# Patient Record
Sex: Female | Born: 1955
Health system: Southern US, Community
[De-identification: ages and names within clinical notes are randomized; demographics above are authoritative.]

## PROBLEM LIST (undated history)

## (undated) DIAGNOSIS — E7800 Pure hypercholesterolemia, unspecified: Secondary | ICD-10-CM

## (undated) DIAGNOSIS — F32A Depression, unspecified: Secondary | ICD-10-CM

## (undated) DIAGNOSIS — F329 Major depressive disorder, single episode, unspecified: Secondary | ICD-10-CM

## (undated) DIAGNOSIS — E119 Type 2 diabetes mellitus without complications: Secondary | ICD-10-CM

## (undated) DIAGNOSIS — I10 Essential (primary) hypertension: Secondary | ICD-10-CM

## (undated) HISTORY — DX: Depression, unspecified: F32.A

## (undated) HISTORY — DX: Major depressive disorder, single episode, unspecified: F32.9

---

## 1997-08-18 ENCOUNTER — Other Ambulatory Visit: Admission: RE | Admit: 1997-08-18 | Discharge: 1997-08-18 | Payer: Self-pay | Admitting: *Deleted

## 1997-08-19 ENCOUNTER — Other Ambulatory Visit: Admission: RE | Admit: 1997-08-19 | Discharge: 1997-08-19 | Payer: Self-pay | Admitting: *Deleted

## 2000-09-06 ENCOUNTER — Other Ambulatory Visit: Admission: RE | Admit: 2000-09-06 | Discharge: 2000-09-06 | Payer: Self-pay | Admitting: Family Medicine

## 2001-11-25 ENCOUNTER — Other Ambulatory Visit: Admission: RE | Admit: 2001-11-25 | Discharge: 2001-11-25 | Payer: Self-pay | Admitting: Family Medicine

## 2001-12-04 ENCOUNTER — Encounter: Payer: Self-pay | Admitting: Family Medicine

## 2001-12-04 ENCOUNTER — Encounter: Admission: RE | Admit: 2001-12-04 | Discharge: 2001-12-04 | Payer: Self-pay | Admitting: Family Medicine

## 2002-12-09 ENCOUNTER — Encounter: Admission: RE | Admit: 2002-12-09 | Discharge: 2002-12-09 | Payer: Self-pay | Admitting: Family Medicine

## 2002-12-09 ENCOUNTER — Encounter: Payer: Self-pay | Admitting: Family Medicine

## 2002-12-16 ENCOUNTER — Encounter: Payer: Self-pay | Admitting: Family Medicine

## 2002-12-16 ENCOUNTER — Encounter: Admission: RE | Admit: 2002-12-16 | Discharge: 2002-12-16 | Payer: Self-pay | Admitting: Family Medicine

## 2003-02-10 ENCOUNTER — Other Ambulatory Visit: Admission: RE | Admit: 2003-02-10 | Discharge: 2003-02-10 | Payer: Self-pay | Admitting: Family Medicine

## 2004-02-10 ENCOUNTER — Encounter: Admission: RE | Admit: 2004-02-10 | Discharge: 2004-02-10 | Payer: Self-pay | Admitting: Family Medicine

## 2004-02-24 ENCOUNTER — Encounter: Admission: RE | Admit: 2004-02-24 | Discharge: 2004-02-24 | Payer: Self-pay | Admitting: Family Medicine

## 2005-04-26 ENCOUNTER — Encounter: Admission: RE | Admit: 2005-04-26 | Discharge: 2005-04-26 | Payer: Self-pay | Admitting: Family Medicine

## 2006-04-29 ENCOUNTER — Encounter: Admission: RE | Admit: 2006-04-29 | Discharge: 2006-04-29 | Payer: Self-pay | Admitting: Family Medicine

## 2006-06-13 ENCOUNTER — Emergency Department (HOSPITAL_COMMUNITY): Admission: EM | Admit: 2006-06-13 | Discharge: 2006-06-14 | Payer: Self-pay | Admitting: Emergency Medicine

## 2007-06-26 ENCOUNTER — Encounter: Admission: RE | Admit: 2007-06-26 | Discharge: 2007-06-26 | Payer: Self-pay | Admitting: Family Medicine

## 2008-12-28 ENCOUNTER — Encounter: Admission: RE | Admit: 2008-12-28 | Discharge: 2008-12-28 | Payer: Self-pay | Admitting: Family Medicine

## 2009-09-06 ENCOUNTER — Emergency Department (HOSPITAL_COMMUNITY)
Admission: EM | Admit: 2009-09-06 | Discharge: 2009-09-06 | Payer: Self-pay | Source: Home / Self Care | Admitting: Emergency Medicine

## 2010-05-26 ENCOUNTER — Other Ambulatory Visit: Payer: Self-pay | Admitting: Family Medicine

## 2010-05-26 DIAGNOSIS — Z1231 Encounter for screening mammogram for malignant neoplasm of breast: Secondary | ICD-10-CM

## 2010-05-31 ENCOUNTER — Ambulatory Visit
Admission: RE | Admit: 2010-05-31 | Discharge: 2010-05-31 | Disposition: A | Discharge status: Home / Self Care | Payer: BC Managed Care – PPO | Source: Ambulatory Visit | Attending: Family Medicine | Admitting: Family Medicine

## 2010-05-31 DIAGNOSIS — Z1231 Encounter for screening mammogram for malignant neoplasm of breast: Secondary | ICD-10-CM

## 2010-06-02 ENCOUNTER — Emergency Department (HOSPITAL_COMMUNITY)
Admission: EM | Admit: 2010-06-02 | Discharge: 2010-06-02 | Disposition: A | Discharge status: Home / Self Care | Payer: BC Managed Care – PPO | Attending: Emergency Medicine | Admitting: Emergency Medicine

## 2010-06-02 ENCOUNTER — Emergency Department (HOSPITAL_COMMUNITY): Payer: BC Managed Care – PPO

## 2010-06-02 DIAGNOSIS — E7800 Pure hypercholesterolemia, unspecified: Secondary | ICD-10-CM | POA: Insufficient documentation

## 2010-06-02 DIAGNOSIS — R079 Chest pain, unspecified: Secondary | ICD-10-CM | POA: Insufficient documentation

## 2010-06-02 DIAGNOSIS — E119 Type 2 diabetes mellitus without complications: Secondary | ICD-10-CM | POA: Insufficient documentation

## 2010-06-02 DIAGNOSIS — F319 Bipolar disorder, unspecified: Secondary | ICD-10-CM | POA: Insufficient documentation

## 2010-06-02 DIAGNOSIS — I10 Essential (primary) hypertension: Secondary | ICD-10-CM | POA: Insufficient documentation

## 2010-06-02 DIAGNOSIS — Z79899 Other long term (current) drug therapy: Secondary | ICD-10-CM | POA: Insufficient documentation

## 2010-06-02 LAB — BASIC METABOLIC PANEL
CO2: 27 mEq/L (ref 19–32)
Creatinine, Ser: 0.63 mg/dL (ref 0.4–1.2)
GFR calc Af Amer: >60 mL/min (ref >60)
Potassium: 3.8 mEq/L (ref 3.5–5.1)
Sodium: 137 mEq/L (ref 135–145)

## 2010-06-02 LAB — DIFFERENTIAL
Basophils Relative: 1 % (ref 0–1)
Eosinophils Absolute: 0.4 10*3/uL (ref 0.0–0.7)
Lymphocytes Relative: 36 % (ref 12–46)
Neutro Abs: 4 10*3/uL (ref 1.7–7.7)
Neutrophils Relative %: 52 % (ref 43–77)

## 2010-06-02 LAB — CBC
HCT: 40.2 % (ref 36.0–46.0)
Hemoglobin: 13.1 g/dL (ref 12.0–15.0)
MCHC: 32.6 g/dL (ref 30.0–36.0)
Platelets: 209 10*3/uL (ref 150–400)
RBC: 4.59 MIL/uL (ref 3.87–5.11)
WBC: 7.7 10*3/uL (ref 4.0–10.5)

## 2010-06-02 LAB — POCT CARDIAC MARKERS
CKMB, poc: 1.4 ng/mL (ref 1.0–8.0)
Myoglobin, poc: 52 ng/mL (ref 12–200)

## 2011-02-12 ENCOUNTER — Ambulatory Visit
Admission: RE | Admit: 2011-02-12 | Discharge: 2011-02-12 | Disposition: A | Discharge status: Home / Self Care | Payer: BC Managed Care – PPO | Source: Ambulatory Visit | Attending: Family Medicine | Admitting: Family Medicine

## 2011-02-12 ENCOUNTER — Other Ambulatory Visit: Payer: Self-pay | Admitting: Family Medicine

## 2011-02-12 DIAGNOSIS — R52 Pain, unspecified: Secondary | ICD-10-CM

## 2011-07-16 ENCOUNTER — Ambulatory Visit
Admission: RE | Admit: 2011-07-16 | Discharge: 2011-07-16 | Disposition: A | Discharge status: Home / Self Care | Payer: BC Managed Care – PPO | Source: Ambulatory Visit | Attending: Family Medicine | Admitting: Family Medicine

## 2011-07-16 ENCOUNTER — Other Ambulatory Visit: Payer: Self-pay | Admitting: Family Medicine

## 2011-07-16 DIAGNOSIS — Z1231 Encounter for screening mammogram for malignant neoplasm of breast: Secondary | ICD-10-CM

## 2011-08-30 ENCOUNTER — Ambulatory Visit: Payer: BC Managed Care – PPO | Admitting: Dietician

## 2012-06-27 ENCOUNTER — Emergency Department (INDEPENDENT_AMBULATORY_CARE_PROVIDER_SITE_OTHER): Payer: 59

## 2012-06-27 ENCOUNTER — Emergency Department (HOSPITAL_COMMUNITY): Payer: 59

## 2012-06-27 ENCOUNTER — Encounter (HOSPITAL_COMMUNITY): Payer: Self-pay | Admitting: Emergency Medicine

## 2012-06-27 ENCOUNTER — Emergency Department (HOSPITAL_COMMUNITY)
Admission: EM | Admit: 2012-06-27 | Discharge: 2012-06-27 | Disposition: A | Discharge status: Home / Self Care | Payer: 59 | Source: Home / Self Care | Attending: Emergency Medicine | Admitting: Emergency Medicine

## 2012-06-27 DIAGNOSIS — S161XXA Strain of muscle, fascia and tendon at neck level, initial encounter: Secondary | ICD-10-CM

## 2012-06-27 DIAGNOSIS — S20211A Contusion of right front wall of thorax, initial encounter: Secondary | ICD-10-CM

## 2012-06-27 DIAGNOSIS — S20219A Contusion of unspecified front wall of thorax, initial encounter: Secondary | ICD-10-CM

## 2012-06-27 DIAGNOSIS — S139XXA Sprain of joints and ligaments of unspecified parts of neck, initial encounter: Secondary | ICD-10-CM

## 2012-06-27 LAB — POCT I-STAT, CHEM 8
BUN: 15 mg/dL (ref 6–23)
Chloride: 103 mEq/L (ref 96–112)
Potassium: 3.7 mEq/L (ref 3.5–5.1)
Sodium: 138 mEq/L (ref 135–145)
TCO2: 24 mmol/L (ref 0–100)

## 2012-06-27 MED ORDER — SALSALATE 750 MG PO TABS: 1500.0000 mg | ORAL_TABLET | Freq: Two times a day (BID) | ORAL | Status: DC

## 2012-06-27 MED ORDER — HYDROCODONE-ACETAMINOPHEN 5-325 MG PO TABS
1.0000 | ORAL_TABLET | Freq: Once | ORAL | Status: AC
  Administered 2012-06-27: 1 via ORAL

## 2012-06-27 MED ORDER — HYDROCODONE-ACETAMINOPHEN 5-325 MG PO TABS: ORAL_TABLET | ORAL | Status: DC

## 2012-06-27 MED ORDER — HYDROCODONE-ACETAMINOPHEN 5-325 MG PO TABS
ORAL_TABLET | ORAL | Status: AC
  Filled 2012-06-27: qty 1

## 2012-06-27 MED ORDER — METHOCARBAMOL 500 MG PO TABS: 500.0000 mg | ORAL_TABLET | Freq: Three times a day (TID) | ORAL | Status: DC

## 2012-06-27 NOTE — ED Provider Notes (Signed)
Chief Complaint:   Chief Complaint  Patient presents with  . Fall    pt fell walking from car scraping right arm and hitting head, no loss of consciousness. pt states that she has been feeling weak and fatigue today.     History of Present Illness:   Amber Patterson is a-year-old female with diabetes and hypertension who was walking across the parking lot of the [employer] today when she tripped over some loose concrete, fell, and hit her head on a concrete pole. There was no loss of consciousness. She complained of some pain in the right side of the neck and the right chest after the fall, she denies any headache or visual symptoms. There was no bleeding from nose or ears. She denies any neurological symptoms. She was able to move her neck but with some pain. She denies any pain with respiration or hemoptysis. She had no abdominal or extremity pain. She states she's felt tired all day long. She's not sure why. She is on a blood pressure pill and 2 diabetes medications although she's not sure the names of the meds. Her blood sugars and blood pressure have been under good control as far she knows. She sees Dr. Nathanial Rancher in [place]. She states she did not pass out and denies any chest pain, tightness, pressure, palpitations, or shortness of breath.  Review of Systems:  Other than noted above, the patient denies any of the following symptoms: Systemic:  No fevers or chills. Eye:  No diplopia or blurred vision. ENT:  No headache, facial pain, or bleeding from the nose or ears.  No loose or broken teeth. Neck:  No neck pain or stiffnes. Resp:  No shortness of breath. Cardiac:  No chest pain. No palpitations, dizziness, syncope or fainting. GI:  No abdominal pain. No nausea, vomiting, or diarrhea. GU:  No blood in urine. M-S:  No extremity pain, swelling, bruising, limited ROM, neck or back pain. Neuro:  No headache, loss of consciousness, seizure activity, dizziness, vertigo, paresthesias, numbness, or  weakness.  No difficulty with speech or ambulation.  PMFSH:  Past medical history, family history, social history, meds, and allergies were reviewed.    Physical Exam:   Vital signs:  BP 178/100  Pulse 66  Temp(Src) 99 F (37.2 C) (Oral)  Resp 20  SpO2 98% General:  Alert, oriented and in no distress. Eye:  PERRL, full EOMs. ENT:  No cranial or facial tenderness to palpation. Neck:  She has tenderness to palpation over the right trapezius ridge and right sternocleidomastoid muscles. There is no midline tenderness to palpation. Her neck has 45 of rotation in each direction but with pain. Heart:  Regular rhythm.  No extrasystoles, gallops, or murmers. Lungs:  There is minimal chest wall tenderness to palpation in the right pectoral area, no swelling, bruising, or deformity. Breath sounds clear and equal bilaterally.  No wheezes, rales or rhonchi. Abdomen:  Non tender. Back:  Non tender to palpation.  Full ROM without pain. Extremities:  No tenderness, swelling, bruising or deformity.  Full ROM of all joints without pain.  Pulses full.  Brisk capillary refill. Neuro:  Alert and oriented times 3.  Cranial nerves intact.  No muscle weakness.  Sensation intact to light touch.  Gait normal. Skin:  No bruising, abrasions, or lacerations.  EKG Results:  Date: 06/27/2012  Rate: 62  Rhythm: normal sinus rhythm and sinus arrhythmia  QRS Axis: normal  Intervals: normal  ST/T Wave abnormalities: nonspecific T wave changes  Conduction Disutrbances:none  Narrative Interpretation: Normal sinus rhythm with sinus arrhythmia, nonspecific T wave abnormalities, with T-wave inversions in leads III, aVF, V4, V5, and V6. All these changes were present on a previous EKG from 06/02/2010.  Old EKG Reviewed: unchanged  Results for orders placed during the hospital encounter of 06/27/12  POCT I-STAT, CHEM 8      Result Value Range   Sodium 138  135 - 145 mEq/L   Potassium 3.7  3.5 - 5.1 mEq/L   Chloride  103  96 - 112 mEq/L   BUN 15  6 - 23 mg/dL   Creatinine, Ser 9.14  0.50 - 1.10 mg/dL   Glucose, Bld 782 (*) 70 - 99 mg/dL   Calcium, Ion 9.56 (*) 1.12 - 1.23 mmol/L   TCO2 24  0 - 100 mmol/L   Hemoglobin 15.0  12.0 - 15.0 g/dL   HCT 21.3  08.6 - 57.8 %   Radiology:  Dg Ribs Unilateral W/chest Right  06/27/2012  *RADIOLOGY REPORT*  Clinical Data: History of fall complaining of pain in the upper right chest.  RIGHT RIBS AND CHEST - 3+ VIEW  Comparison: Chest x-ray 06/02/2010.  Findings: Lung volumes are normal.  No consolidative airspace disease.  No pleural effusions.  No pneumothorax.  No pulmonary nodule or mass noted.  Pulmonary vasculature and the cardiomediastinal silhouette are within normal limits.  Dedicated views of the right-sided ribs demonstrate no acute displaced rib fractures.  IMPRESSION: 1. No radiographic evidence of acute cardiopulmonary disease. 2.  No acute displaced rib fractures.   Original Report Authenticated By: Trudie Reed, M.D.    Dg Cervical Spine Complete  06/27/2012  *RADIOLOGY REPORT*  Clinical Data: Fall with neck pain.  CERVICAL SPINE - COMPLETE 4+ VIEW  Comparison: 09/06/2009 cervical spine CT  Findings: Normal alignment is noted. There is no evidence of fracture, subluxation or prevertebral soft tissue swelling. Moderate degenerative disc disease and spondylosis from C4-C7 noted. No focal bony lesions are identified.  IMPRESSION: No static evidence of acute injury to the cervical spine.  Moderate degenerative changes from C4-C7.   Original Report Authenticated By: Harmon Pier, M.D.    Course in Urgent Care Center:   She was given Norco 5/325 with good relief of her pain.  Assessment:  The primary encounter diagnosis was Cervical strain, initial encounter. A diagnosis of Chest wall contusion, right, initial encounter was also pertinent to this visit.  She has marked degenerative disease and spondylosis from C4-C7. This may predispose her to a neck strain. Her  tiredness and fatigue may be due to diabetes and blood pressure not been well controlled. I have suggested that she see her primary care physician next week to discuss her diabetic and blood pressure control. I do not want to make any changes in her medication right now, since she was unable to give me the names of her blood pressure and diabetes medication.  Plan:   1.  The following meds were prescribed:   New Prescriptions   HYDROCODONE-ACETAMINOPHEN (NORCO/VICODIN) 5-325 MG PER TABLET    1 to 2 tabs every 4 to 6 hours as needed for pain.   METHOCARBAMOL (ROBAXIN) 500 MG TABLET    Take 1 tablet (500 mg total) by mouth 3 (three) times daily.   SALSALATE (DISALCID) 750 MG TABLET    Take 2 tablets (1,500 mg total) by mouth 2 (two) times daily.   2.  The patient was instructed in symptomatic care and handouts were given. 3.  The patient was told to return if becoming worse in any way, if no better in 3 or 4 days, and given some red flag symptoms such as headache, worsening neck pain, neurological symptoms, or change in level of consciousness that would indicate earlier return.    Reuben Likes, MD 06/27/12 2129

## 2012-06-27 NOTE — ED Notes (Signed)
Pt states that while walking across parking lot was feeling dizzy/fatigue/weak. Fell scraping right arm and hitting head, did not lose consciousness. States feeling weak and fatigue all day.

## 2012-08-04 ENCOUNTER — Other Ambulatory Visit: Payer: Self-pay | Admitting: Family Medicine

## 2012-08-04 DIAGNOSIS — Z1231 Encounter for screening mammogram for malignant neoplasm of breast: Secondary | ICD-10-CM

## 2012-08-15 ENCOUNTER — Ambulatory Visit
Admission: RE | Admit: 2012-08-15 | Discharge: 2012-08-15 | Disposition: A | Discharge status: Home / Self Care | Payer: 59 | Source: Ambulatory Visit | Attending: Family Medicine | Admitting: Family Medicine

## 2012-08-15 DIAGNOSIS — Z1231 Encounter for screening mammogram for malignant neoplasm of breast: Secondary | ICD-10-CM

## 2012-12-09 ENCOUNTER — Encounter (HOSPITAL_COMMUNITY): Payer: Self-pay | Admitting: Emergency Medicine

## 2012-12-09 ENCOUNTER — Emergency Department (INDEPENDENT_AMBULATORY_CARE_PROVIDER_SITE_OTHER): Payer: PRIVATE HEALTH INSURANCE

## 2012-12-09 ENCOUNTER — Emergency Department (HOSPITAL_COMMUNITY)
Admission: EM | Admit: 2012-12-09 | Discharge: 2012-12-09 | Disposition: A | Discharge status: Home / Self Care | Payer: Self-pay | Source: Home / Self Care | Attending: Emergency Medicine | Admitting: Emergency Medicine

## 2012-12-09 DIAGNOSIS — S335XXA Sprain of ligaments of lumbar spine, initial encounter: Secondary | ICD-10-CM

## 2012-12-09 DIAGNOSIS — S139XXA Sprain of joints and ligaments of unspecified parts of neck, initial encounter: Secondary | ICD-10-CM

## 2012-12-09 DIAGNOSIS — S39012A Strain of muscle, fascia and tendon of lower back, initial encounter: Secondary | ICD-10-CM

## 2012-12-09 DIAGNOSIS — S161XXA Strain of muscle, fascia and tendon at neck level, initial encounter: Secondary | ICD-10-CM

## 2012-12-09 MED ORDER — METHOCARBAMOL 500 MG PO TABS: 500.0000 mg | ORAL_TABLET | Freq: Two times a day (BID) | ORAL | Status: DC

## 2012-12-09 MED ORDER — HYDROCODONE-ACETAMINOPHEN 5-325 MG PO TABS: 2.0000 | ORAL_TABLET | ORAL | Status: DC | PRN
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
## 2012-12-09 NOTE — ED Provider Notes (Signed)
Medical screening examination/treatment/procedure(s) were performed by non-physician practitioner and as supervising physician I was immediately available for consultation/collaboration.  Vander Kueker, M.D.  Mahiya Kercheval C Karesa Maultsby, MD 12/09/12 2117 

## 2013-04-23 ENCOUNTER — Encounter (HOSPITAL_COMMUNITY): Payer: Self-pay | Admitting: Emergency Medicine

## 2013-04-23 ENCOUNTER — Other Ambulatory Visit (HOSPITAL_COMMUNITY)
Admission: RE | Admit: 2013-04-23 | Discharge: 2013-04-23 | Disposition: A | Discharge status: Home / Self Care | Payer: 59 | Source: Ambulatory Visit | Attending: Emergency Medicine | Admitting: Emergency Medicine

## 2013-04-23 ENCOUNTER — Emergency Department (INDEPENDENT_AMBULATORY_CARE_PROVIDER_SITE_OTHER)
Admission: EM | Admit: 2013-04-23 | Discharge: 2013-04-23 | Disposition: A | Discharge status: Home / Self Care | Payer: 59 | Source: Home / Self Care | Attending: Emergency Medicine | Admitting: Emergency Medicine

## 2013-04-23 DIAGNOSIS — Z113 Encounter for screening for infections with a predominantly sexual mode of transmission: Secondary | ICD-10-CM | POA: Insufficient documentation

## 2013-04-23 DIAGNOSIS — B373 Candidiasis of vulva and vagina: Secondary | ICD-10-CM

## 2013-04-23 DIAGNOSIS — IMO0002 Reserved for concepts with insufficient information to code with codable children: Secondary | ICD-10-CM

## 2013-04-23 DIAGNOSIS — N760 Acute vaginitis: Secondary | ICD-10-CM | POA: Insufficient documentation

## 2013-04-23 DIAGNOSIS — IMO0001 Reserved for inherently not codable concepts without codable children: Secondary | ICD-10-CM

## 2013-04-23 DIAGNOSIS — E1165 Type 2 diabetes mellitus with hyperglycemia: Secondary | ICD-10-CM

## 2013-04-23 DIAGNOSIS — B3731 Acute candidiasis of vulva and vagina: Secondary | ICD-10-CM

## 2013-04-23 HISTORY — DX: Pure hypercholesterolemia, unspecified: E78.00

## 2013-04-23 HISTORY — DX: Essential (primary) hypertension: I10

## 2013-04-23 HISTORY — DX: Type 2 diabetes mellitus without complications: E11.9

## 2013-04-23 LAB — CERVICOVAGINAL ANCILLARY ONLY
WET PREP (BD AFFIRM): NEGATIVE
WET PREP (BD AFFIRM): NEGATIVE
Wet Prep (BD Affirm): NEGATIVE

## 2013-04-23 LAB — POCT URINALYSIS DIP (DEVICE)
Bilirubin Urine: NEGATIVE
Glucose, UA: >=1000 mg/dL — AB
KETONES UR: NEGATIVE mg/dL
LEUKOCYTES UA: NEGATIVE
Nitrite: NEGATIVE
PH: 5.5 (ref 5.0–8.0)
PROTEIN: NEGATIVE mg/dL
Specific Gravity, Urine: 1.015 (ref 1.005–1.030)
UROBILINOGEN UA: 0.2 mg/dL (ref 0.0–1.0)

## 2013-04-23 LAB — HIV ANTIBODY (ROUTINE TESTING W REFLEX): HIV: NONREACTIVE

## 2013-04-23 LAB — RPR: RPR Ser Ql: NONREACTIVE

## 2013-04-23 LAB — GLUCOSE, CAPILLARY: GLUCOSE-CAPILLARY: 328 mg/dL — AB (ref 70–99)

## 2013-04-23 MED ORDER — FLUCONAZOLE 200 MG PO TABS: 200.0000 mg | ORAL_TABLET | ORAL | Status: AC

## 2013-04-23 MED ORDER — TERCONAZOLE 0.4 % VA CREA: 1.0000 | TOPICAL_CREAM | Freq: Every day | VAGINAL | Status: DC

## 2013-04-23 MED ORDER — CEPHALEXIN 500 MG PO CAPS: 500.0000 mg | ORAL_CAPSULE | Freq: Four times a day (QID) | ORAL | Status: DC

## 2013-04-23 NOTE — Discharge Instructions (Signed)
Follow up with your doctor next week.  You need to have your skin rechecked to make sure it is healing. Please start taking your diabetes medicine again.  You are important! Your family wants you around for a long time.    Disorders of the Vulva It is important to know the outside (external) area of the female genitalia to properly examine yourself. The vulva or labia, sometimes also called the lips around the vagina, covers the pubic bone and surrounds the vagina. The larger or outer labia is called the labia majora. The inner or smaller labia is called the labia minora. The clitoris is at the top of the vulva. It is covered by a small area of tissue called the hood. Below the clitoris is the opening of the tube from the bladder, which is the urethral opening. Just below the urethral opening is the opening of the vagina. This area is called the vestibule. Inside the vestibule are bartholin and skene glands that lubricate the outside of the vagina during sexual intercourse. The perineum is the area that lies between the vagina and anus. You should examine your external genitalia once a month just as you do your self breast examination. SIGNS AND SYMPTOMS TO LOOK FOR DURING YOUR EXAMINATION:  Swelling.  Redness.  Bumps.  Blisters.  Black or white areas.  Itching.  Bleeding.  Burning. TYPES OF VULVAR PROBLEMS  Infections.  Yeast (fungus) is the most common infection that causes redness, swelling, itching and a thick white vaginal discharge. Women with diabetes, taking medicines that kill germs (antibiotics) or on birth control pills are at risk for yeast infection. This infection is treated with vaginal creams, suppositories and anti-itch cream for the vulva.  Genital warts (condyloma) are caused by the human papilloma virus. They are raised bumps that can itch, bleed, cause discomfort and sometimes appear in groups like cauliflower. This is a sexually transmitted disease (STD) caused by  sexual contact. This can be treated with topical medication, freezing, electrocautery, laser or surgical removal.  Genital herpes is a virus that causes redness, swelling, blisters and ulcers that can cause itching and are very painful. This is also a STD. There are medications to control the symptoms of herpes, but there is no cure. Once you have it, you keep getting it because the virus stays in your body.  Contact dermatitis. Contact dermatitis is an irritation of the vulva that can cause redness, swelling and itching. It can be caused by:  Perfumed toilet paper.  Deodorants.  Talcum powder.  Hygiene sprays.  Tampons.  Soaps and fabric softeners.  Wearing wet underwear and bathing suits too long.  Spermicide.  Condoms.  Diaphragms.  Poison ivy. Treatment will depend on eliminating the cause and treating the symptoms.  Vulvodynia.  Vulvodynia is "painful vulva." It includes burning, itching, irritation and a feeling of rawness or bruising of the vulva. Treating this problem depends on the cause and diagnosis. Treatment can take a long time.  Vulvar Dystrophy.  Vulvar Dystrophy is a disorder of the skin of the vulva. The skin can be too thick with hard raised patches or too thin skin showing wrinkles. Vulvar dystrophy can cause redness or white patches, itching and burning sensation. A biopsy may be needed to diagnose the problem. Treatment with creams or ointments depends on the diagnosis.  Vulvar Cancer.  Vulvar cancer is usually a form of squamus skin cancer. Other types of vulvar cancer like melenoma (a dark or black, irregular shaped mole that  can bleed easily) and adenocarcinoma (red, itchy, scaly area that looks like eczema) are rare. Treatment is usually surgery to remove the cancerous area, the vulva and the lymph nodes in the groin. This can be done with or without radiation and chemotherapy. HOME CARE INSTRUCTIONS   Look at your vulva and external genital area  every month.  Follow and finish all treatment given to you by your caregiver.  Do not use perfumed or scented soaps, detergents, hygiene spray, talcum powder, tampons, douches or toilet paper.  Remove your tampon every 6 hours. Do not leave tampons in overnight.  Wear loose-fitting clothing.  Wear underwear that is cotton.  Avoid spermicidal creams or foams that irritate you. SEEK MEDICAL CARE IF:  Type 2 Diabetes Mellitus, Adult Type 2 diabetes mellitus is a long-term (chronic) disease. In type 2 diabetes:  The pancreas does not make enough of a hormone called insulin.  The cells in the body do not respond as well to the insulin that is made.  Both of the above can happen. Normally, insulin moves sugars from food into tissue cells. This gives you energy. If you have type 2 diabetes, sugars cannot be moved into tissue cells. This causes high blood sugar (hyperglycemia).  HOME CARE  Have your hemoglobin A1c level checked twice a year. The level shows if your diabetes is under control or out of control.  Perform daily blood sugar testing as told by your doctor.  Check your ketone levels by testing your pee (urine) when you are sick and as told.  Take your diabetes or insulin medicine as told by your doctor.  Never run out of insulin.  Adjust how much insulin you give yourself based on how many carbs (carbohydrates) you eat. Carbs are in many foods, such as fruits, vegetables, whole grains, and dairy products.  Have a healthy snack between every healthy meal. Have 3 meals and 3 snacks a day.  Lose weight if you are overweight.  Carry a medical alert card or wear your medical alert jewelry.  Carry a 15 gram carb snack with you at all times. Examples include:  Glucose pills, 3 or 4.  Glucose gel, 15 gram tube.  Raisins, 2 tablespoons (24 grams).  Jelly beans, 6.  Animal crackers, 8.  Sugar pop, 4 ounces (120 milliliters).  Gummy treats, 9.  Notice low blood sugar  (hypoglycemia) symptoms, such as:  Shaking (tremors).  Decreased ability to think clearly.  Sweating.  Increased heart rate.  Headache.  Dry mouth.  Hunger.  Crabbiness (irritability).  Being worried or tense (anxiety).  Restless sleep.  A change in speech or coordination.  Confusion.  Treat low blood sugar right away. If you are alert and can swallow, follow the 15:15 rule:  Take 15 20 grams of a rapid-acting glucose or carb. This includes glucose gel, glucose pills, or 4 ounces (120 milliliters) of fruit juice, regular pop, or low-fat milk.  Check your blood sugar level after taking the glucose.  Take 15 20 grams of more glucose if the repeat blood sugar level is still 70 mg/dL (milligrams/deciliter) or below.  Eat a meal or snack within 1 hour of the blood sugar levels going back to normal.  Notice early symptoms of high blood sugar, such as:  Being really thirsty or drinking a lot (polydipsia).  Peeing (urinating) a lot (polyuria).  Do at least 150 minutes of physical activity a week or as told.  Split the 150 minutes of activity up during the  week. Do not do 150 minutes of activity in one day.  Perform exercises, such as weight lifting, at least 2 times a week or as told.  Adjust your insulin or food intake as needed if you start a new exercise or sport.  Follow your sick day plan when you are not able to eat or drink as usual.  Avoid tobacco use.  Women who are not pregnant should drink no more than 1 drink a day. Men should drink no more than 2 drinks a day.  Only drink alcohol with food.  Ask your doctor if alcohol is safe for you.  Tell your doctor if you drink alcohol several times during the week.  See your doctor regularly.  Schedule an eye exam soon after you are diagnosed with diabetes. Schedule exams once every year.  Check your skin and feet every day. Check for cuts, bruises, redness, nail problems, bleeding, blisters, or sores. A  doctor should do a foot exam once a year.  Brush your teeth and gums twice a day. Floss once a day. Visit your dentist regularly.  Share your diabetes plan with your workplace or school.  Stay up-to-date with shots that fight against diseases (immunizations).  Learn how to manage stress.  Get diabetes education and support as needed.  Ask your doctor for special help if:  You need help to maintain or improve how you to do things on your own.  You need help to maintain or improve the quality of your life.  You have foot or hand problems.  You have trouble cleaning yourself, dressing, eating, or doing physical activity. GET HELP RIGHT AWAY IF:  You have trouble breathing.  You have moderate to large ketone levels.  You are unable to eat food or drink fluids for more than 6 hours.  You feel sick to your stomach (nauseous) or throw up (vomit) for more than 6 hours.  Your blood sugar level is over 240 mg/dL.  There is a change in mental status.  You get another serious illness.  You have watery poop (diarrhea) for more than 6 hours.  You have been sick or have had a fever for 2 or more days and are not getting better.  You have pain when you are physically active. MAKE SURE YOU:  Understand these instructions.  Will watch your condition.  Will get help right away if you are not doing well or get worse. Document Released: 11/29/2007 Document Revised: 12/10/2012 Document Reviewed: 06/20/2012 [facility] Patient Information 2014 [place], [place].   You notice changes on your vulva such as redness, swelling, itching, color changes, bleeding, small bumps, blisters or any discomfort.  You develop an abnormal vaginal discharge.  You have painful sexual intercourse.  You notice swelling of the lymph nodes in your groin. Document Released: 08/08/2007 Document Revised: 05/14/2011 Document Reviewed: 05/22/2010 [facility] Patient Information 2014 [place], [place].

## 2013-04-23 NOTE — ED Provider Notes (Signed)
Medical screening examination/treatment/procedure(s) were performed by non-physician practitioner and as supervising physician I was immediately available for consultation/collaboration.  Leslee Homeavid Benino Korinek, M.D.  Reuben Likesavid C Jhoselin Crume, MD 04/23/13 1045

## 2013-04-23 NOTE — ED Notes (Signed)
C/o yeast infection due to having a discharge States area is itchy and has odor Would like to be checked for std

## 2013-04-23 NOTE — ED Provider Notes (Signed)
CSN: [id number]     Arrival date & time 04/23/13  [id number] History   None    Chief Complaint  Patient presents with  . Vaginitis  . Exposure to STD     (Consider location/radiation/quality/duration/timing/severity/associated sxs/prior Treatment) HPI Comments: Hx dm. Does not take her medicine.  Has had vaginal yeast infections on and off for an unknown period of time (pt cannot specify). Latest sx have been around for about a month. Has had unprotected sex at some point in time recently (pt cannot specify) and she is worried about STIs.     Patient is a 58 y.o. female presenting with vaginal itching. The history is provided by the patient.  Vaginal Itching This is a new problem. Episode onset: "awhile ago" The problem occurs constantly. The problem has been gradually worsening. Pertinent negatives include no abdominal pain. Exacerbated by: feels pain in vulvar area with urination. Nothing relieves the symptoms. Treatments tried: otc yeast infection oral pills. The treatment provided no relief.    Past Medical History  Diagnosis Date  . Hypertension   . High cholesterol   . Diabetes mellitus without complication    History reviewed. No pertinent past surgical history. History reviewed. No pertinent family history. History  Substance Use Topics  . Smoking status: Never Smoker   . Smokeless tobacco: Not on file  . Alcohol Use: No   OB History   Grav Para Term Preterm Abortions TAB SAB Ect Mult Living                 Review of Systems  Constitutional: Negative for fever and chills.  Gastrointestinal: Negative for nausea, vomiting and abdominal pain.  Genitourinary: Negative for dysuria, flank pain and vaginal discharge.       Vulvar pain/irritation      Allergies  Review of patient's allergies indicates no known allergies.  Home Medications   Current Outpatient Rx  Name  Route  Sig  Dispense  Refill  . cephALEXin (KEFLEX) 500 MG capsule   Oral   Take 1 capsule (500 mg  total) by mouth 4 (four) times daily.   40 capsule   0   . fluconazole (DIFLUCAN) 200 MG tablet   Oral   Take 1 tablet (200 mg total) by mouth every 3 (three) days.   3 tablet   0   . glipiZIDE (GLUCOTROL XL) 10 MG 24 hr tablet   Oral   Take 10 mg by mouth 2 (two) times daily.         Marland Kitchen. HYDROcodone-acetaminophen (NORCO/VICODIN) 5-325 MG per tablet   Oral   Take 2 tablets by mouth every 4 (four) hours as needed for pain.   6 tablet   0   . lisinopril (PRINIVIL,ZESTRIL) 20 MG tablet   Oral   Take 20 mg by mouth daily.         Marland Kitchen. lithium carbonate (ESKALITH) 450 MG CR tablet   Oral   Take 450 mg by mouth 2 (two) times daily.         . methocarbamol (ROBAXIN) 500 MG tablet   Oral   Take 1 tablet (500 mg total) by mouth 2 (two) times daily.   20 tablet   0   . pravastatin (PRAVACHOL) 40 MG tablet   Oral   Take 40 mg by mouth daily.         . salsalate (DISALCID) 750 MG tablet   Oral   Take 2 tablets (1,500 mg total) by mouth 2 (  two) times daily.   120 tablet   0   . terconazole (TERAZOL 7) 0.4 % vaginal cream   Vaginal   Place 1 applicator vaginally at bedtime. And also apply to vulvar area qhs   45 g   0    BP 164/94  Pulse 76  Temp(Src) 97.6 F (36.4 C) (Oral)  Resp 16  SpO2 100% Physical Exam  Constitutional: She appears well-developed and well-nourished. No distress.  Cardiovascular: Normal rate and regular rhythm.   Pulmonary/Chest: Effort normal and breath sounds normal.  Abdominal: Soft. Bowel sounds are normal. She exhibits no distension. There is no tenderness. There is no rebound.  Genitourinary: Vagina normal. There is rash and tenderness on the right labia. There is rash and tenderness on the left labia. No erythema or tenderness around the vagina. No vaginal discharge found.  Vulva is inflamed, swollen, extremely tender to palp; skin of vulva is irritated, red, shiny and bleeding in places.   Lymphadenopathy:       Right: No inguinal  adenopathy present.       Left: No inguinal adenopathy present.    ED Course  Procedures (including critical care time) Labs Review Labs Reviewed  GLUCOSE, CAPILLARY - Abnormal; Notable for the following:    Glucose-Capillary 328 (*)    All other components within normal limits  POCT URINALYSIS DIP (DEVICE) - Abnormal; Notable for the following:    Glucose, UA >=1000 (*)    Hgb urine dipstick SMALL (*)    All other components within normal limits  RPR  HIV ANTIBODY (ROUTINE TESTING)  CERVICOVAGINAL ANCILLARY ONLY   Imaging Review No results found.    MDM   Final diagnoses:  Vulvar candidiasis  Uncontrolled diabetes mellitus  Vulvar area c/w yeast infection, but also concerned about secondary bacterial infection.   Rx terazol 0.4% for 7 days, #45gm. Rx diflucan 200mg  q72 hours #3. Rx keflex 500mg  QID #40. Pt to start taking diabetes medicines again and f/u with pcp next week.         Cathlyn Parsons, NP 04/23/13 [phone]

## 2013-04-24 LAB — CERVICOVAGINAL ANCILLARY ONLY
CHLAMYDIA, DNA PROBE: NEGATIVE
Neisseria Gonorrhea: NEGATIVE

## 2013-09-11 ENCOUNTER — Encounter (HOSPITAL_COMMUNITY): Payer: Self-pay | Admitting: Emergency Medicine

## 2013-09-11 ENCOUNTER — Emergency Department (HOSPITAL_COMMUNITY): Payer: 59

## 2013-09-11 ENCOUNTER — Emergency Department (HOSPITAL_COMMUNITY)
Admission: EM | Admit: 2013-09-11 | Discharge: 2013-09-11 | Disposition: A | Discharge status: Home / Self Care | Payer: 59 | Attending: Emergency Medicine | Admitting: Emergency Medicine

## 2013-09-11 DIAGNOSIS — E7800 Pure hypercholesterolemia, unspecified: Secondary | ICD-10-CM | POA: Insufficient documentation

## 2013-09-11 DIAGNOSIS — E119 Type 2 diabetes mellitus without complications: Secondary | ICD-10-CM | POA: Insufficient documentation

## 2013-09-11 DIAGNOSIS — I10 Essential (primary) hypertension: Secondary | ICD-10-CM | POA: Insufficient documentation

## 2013-09-11 DIAGNOSIS — R079 Chest pain, unspecified: Secondary | ICD-10-CM | POA: Insufficient documentation

## 2013-09-11 DIAGNOSIS — Z7982 Long term (current) use of aspirin: Secondary | ICD-10-CM | POA: Insufficient documentation

## 2013-09-11 DIAGNOSIS — Z79899 Other long term (current) drug therapy: Secondary | ICD-10-CM | POA: Insufficient documentation

## 2013-09-11 LAB — CBC
HCT: 42.5 % (ref 36.0–46.0)
Hemoglobin: 13.8 g/dL (ref 12.0–15.0)
MCH: 28.6 pg (ref 26.0–34.0)
MCHC: 32.5 g/dL (ref 30.0–36.0)
MCV: 88 fL (ref 78.0–100.0)
PLATELETS: 218 10*3/uL (ref 150–400)
RBC: 4.83 MIL/uL (ref 3.87–5.11)
RDW: 12.9 % (ref 11.5–15.5)
WBC: 5.7 10*3/uL (ref 4.0–10.5)

## 2013-09-11 LAB — I-STAT TROPONIN, ED
Troponin i, poc: 0 ng/mL (ref 0.00–0.08)
Troponin i, poc: 0.01 ng/mL (ref 0.00–0.08)

## 2013-09-11 LAB — BASIC METABOLIC PANEL
ANION GAP: 14 (ref 5–15)
BUN: 16 mg/dL (ref 6–23)
CALCIUM: 9.3 mg/dL (ref 8.4–10.5)
CO2: 23 meq/L (ref 19–32)
Chloride: 98 mEq/L (ref 96–112)
Creatinine, Ser: 0.52 mg/dL (ref 0.50–1.10)
GFR calc Af Amer: >90 mL/min (ref >90)
Glucose, Bld: 289 mg/dL — ABNORMAL HIGH (ref 70–99)
Potassium: 4.4 mEq/L (ref 3.7–5.3)
SODIUM: 135 meq/L — AB (ref 137–147)

## 2013-09-11 LAB — TROPONIN I

## 2013-09-11 LAB — CBG MONITORING, ED: Glucose-Capillary: 253 mg/dL — ABNORMAL HIGH (ref 70–99)

## 2013-09-11 MED ORDER — HYDROCODONE-ACETAMINOPHEN 5-325 MG PO TABS: 2.0000 | ORAL_TABLET | ORAL | Status: DC | PRN

## 2013-09-11 MED ORDER — MORPHINE SULFATE 4 MG/ML IJ SOLN
4.0000 mg | Freq: Once | INTRAMUSCULAR | Status: AC
  Administered 2013-09-11: 4 mg via INTRAVENOUS
  Filled 2013-09-11: qty 1

## 2013-09-11 MED ORDER — IOHEXOL 350 MG/ML SOLN
100.0000 mL | Freq: Once | INTRAVENOUS | Status: AC | PRN
  Administered 2013-09-11: 100 mL via INTRAVENOUS

## 2013-09-11 MED ORDER — SODIUM CHLORIDE 0.9 % IV BOLUS (SEPSIS)
1000.0000 mL | Freq: Once | INTRAVENOUS | Status: AC
  Administered 2013-09-11: 1000 mL via INTRAVENOUS

## 2013-09-11 NOTE — ED Notes (Signed)
Pt returned to room from CT, pt placed on cardiac monitor

## 2013-09-11 NOTE — ED Notes (Signed)
Pt started having chest pain an hour ago. Right sided chest pain. Denies SOB. Hx of HTN, HLD DM. CBG 428. Chest Pain 7/10 nonradiating. V3, V4, V5 ST elevation on EKG per EMS. BP 159/99, dropped to 101/77 after 1 nitro. No further nitro given. Pain came down to 5/10- now has HA. Chest Pain currently 7/10

## 2013-09-11 NOTE — Discharge Instructions (Signed)

## 2013-09-11 NOTE — ED Notes (Signed)
RN notified [facility] 253

## 2013-09-11 NOTE — ED Provider Notes (Signed)
CSN: [id number]     Arrival date & time 09/11/13  0865 History   First MD Initiated Contact with Patient 09/11/13 [phone]     Chief Complaint  Patient presents with  . Chest Pain     (Consider location/radiation/quality/duration/timing/severity/associated sxs/prior Treatment) HPI Comments: Patient presents to ER for evaluation of chest pain. Patient has been experiencing intermittent right-sided chest pain for one week. Patient reports that the pain is intermittent. It occurs and lasts for minutes and then resolves. She has not noticed any obvious relation to exertion. She does not feel short of breath when the pain is present. Patient is not currently present. She was brought to the ER by EMS. Blood sugar is elevated. She had 7/10 pain when EMS picked her up, administered nitroglycerin. Blood pressure dropped significantly after one nitroglycerin, no further nitroglycerin given. She did complain of a headache after nitroglycerin.  Patient is a 58 y.o. female presenting with chest pain.  Chest Pain   Past Medical History  Diagnosis Date  . Hypertension   . High cholesterol   . Diabetes mellitus without complication    No past surgical history on file. No family history on file. History  Substance Use Topics  . Smoking status: Never Smoker   . Smokeless tobacco: Not on file  . Alcohol Use: No   OB History   Grav Para Term Preterm Abortions TAB SAB Ect Mult Living                 Review of Systems  Cardiovascular: Positive for chest pain.  All other systems reviewed and are negative.     Allergies  Review of patient's allergies indicates no known allergies.  Home Medications   Prior to Admission medications   Medication Sig Start Date End Date Taking? Authorizing Provider  aspirin EC 81 MG tablet Take 81 mg by mouth daily.   Yes Historical Provider, MD  glipiZIDE (GLUCOTROL XL) 10 MG 24 hr tablet Take 10 mg by mouth 2 (two) times daily.   Yes Historical Provider, MD   lisinopril (PRINIVIL,ZESTRIL) 20 MG tablet Take 20 mg by mouth daily.   Yes Historical Provider, MD  lithium carbonate (ESKALITH) 450 MG CR tablet Take 450 mg by mouth 2 (two) times daily.   Yes Historical Provider, MD  pravastatin (PRAVACHOL) 40 MG tablet Take 40 mg by mouth daily.   Yes Historical Provider, MD   BP 100/67  Pulse 62  Temp(Src) 97.5 F (36.4 C) (Oral)  Resp 11  SpO2 100% Physical Exam  Constitutional: She is oriented to person, place, and time. She appears well-developed and well-nourished. No distress.  HENT:  Head: Normocephalic and atraumatic.  Right Ear: Hearing normal.  Left Ear: Hearing normal.  Nose: Nose normal.  Mouth/Throat: Oropharynx is clear and moist and mucous membranes are normal.  Eyes: Conjunctivae and EOM are normal. Pupils are equal, round, and reactive to light.  Neck: Normal range of motion. Neck supple.  Cardiovascular: Regular rhythm, S1 normal and S2 normal.  Exam reveals no gallop and no friction rub.   No murmur heard. Pulmonary/Chest: Effort normal and breath sounds normal. No respiratory distress. She exhibits no tenderness.  Abdominal: Soft. Normal appearance and bowel sounds are normal. There is no hepatosplenomegaly. There is no tenderness. There is no rebound, no guarding, no tenderness at McBurney's point and negative Murphy's sign. No hernia.  Musculoskeletal: Normal range of motion.  Neurological: She is alert and oriented to person, place, and time. She has normal  strength. No cranial nerve deficit or sensory deficit. Coordination normal. GCS eye subscore is 4. GCS verbal subscore is 5. GCS motor subscore is 6.  Skin: Skin is warm, dry and intact. No rash noted. No cyanosis.  Psychiatric: She has a normal mood and affect. Her speech is normal and behavior is normal. Thought content normal.    ED Course  Procedures (including critical care time) Labs Review Labs Reviewed  CBC  BASIC METABOLIC PANEL  TROPONIN I  I-STAT  TROPOININ, ED    Imaging Review No results found.   EKG Interpretation   Date/Time:  Friday September 11 2013 09:15:19 EDT Ventricular Rate:  62 PR Interval:  136 QRS Duration: 84 QT Interval:  432 QTC Calculation: 438 R Axis:   26 Text Interpretation:  Normal sinus rhythm T wave abnormality, consider  lateral ischemia Abnormal ECG No significant change since last tracing  Confirmed by Markea Ruzich  MD, Vrinda Heckstall ([id number]) on 09/11/2013 9:19:14 AM      MDM   Final diagnoses:  None   chest pain  She presents with atypical chest pain. She was experiencing intermittent right-sided pain that last for minutes at a time and then resolves. Pain occurs at rest. She does have diabetes, hypertension and high cholesterol. Based on the very atypical nature of the pain, however, she is felt to be low risk. EKG and troponin on arrival are unremarkable. She did have some lateral T wave abnormalities on the EKG, but this is consistent with previous EKGs, no change from prior. Patient was monitored in the ER for a prolonged period of time and had a second troponin which was still negative. It was therefore felt that she was appropriate for outpatient management by primary doctor.    Gilda Creasehristopher J. Ethelmae Ringel, MD 09/11/13 [phone]

## 2013-11-10 ENCOUNTER — Other Ambulatory Visit: Payer: Self-pay

## 2013-11-10 DIAGNOSIS — Z1231 Encounter for screening mammogram for malignant neoplasm of breast: Secondary | ICD-10-CM

## 2013-11-23 ENCOUNTER — Ambulatory Visit
Admission: RE | Admit: 2013-11-23 | Discharge: 2013-11-23 | Disposition: A | Discharge status: Home / Self Care | Payer: 59 | Source: Ambulatory Visit

## 2013-11-23 DIAGNOSIS — Z1231 Encounter for screening mammogram for malignant neoplasm of breast: Secondary | ICD-10-CM

## 2014-08-30 ENCOUNTER — Other Ambulatory Visit: Payer: Self-pay

## 2014-08-30 DIAGNOSIS — Z1231 Encounter for screening mammogram for malignant neoplasm of breast: Secondary | ICD-10-CM

## 2014-08-30 DIAGNOSIS — Z803 Family history of malignant neoplasm of breast: Secondary | ICD-10-CM

## 2014-11-29 ENCOUNTER — Ambulatory Visit
Admission: RE | Admit: 2014-11-29 | Discharge: 2014-11-29 | Disposition: A | Discharge status: Home / Self Care | Payer: Commercial Managed Care - HMO | Source: Ambulatory Visit

## 2014-11-29 DIAGNOSIS — Z803 Family history of malignant neoplasm of breast: Secondary | ICD-10-CM

## 2014-11-29 DIAGNOSIS — Z1231 Encounter for screening mammogram for malignant neoplasm of breast: Secondary | ICD-10-CM

## 2015-08-17 ENCOUNTER — Encounter (HOSPITAL_COMMUNITY): Payer: Self-pay | Admitting: *Deleted

## 2015-08-17 ENCOUNTER — Ambulatory Visit (HOSPITAL_COMMUNITY)
Admission: EM | Admit: 2015-08-17 | Discharge: 2015-08-17 | Disposition: A | Discharge status: Home / Self Care | Payer: Commercial Managed Care - HMO | Attending: Emergency Medicine | Admitting: Emergency Medicine

## 2015-08-17 DIAGNOSIS — M1712 Unilateral primary osteoarthritis, left knee: Secondary | ICD-10-CM

## 2015-08-17 DIAGNOSIS — G8929 Other chronic pain: Secondary | ICD-10-CM

## 2015-08-17 DIAGNOSIS — M25562 Pain in left knee: Secondary | ICD-10-CM

## 2015-08-17 MED ORDER — MELOXICAM 15 MG PO TABS: 15.0000 mg | ORAL_TABLET | Freq: Every day | ORAL | Status: DC

## 2015-08-17 NOTE — ED Notes (Signed)
Pt  Reports     l  Knee   Is   Swollen  And  painfull       denys  Any  Injury        Pt   Reports    Symptoms  Not  releived  By  otc remedys   Such   As     Icy  Hot    Pt  Reports  Has  Had  Pain in the  Knees  Before     In  Past

## 2015-08-17 NOTE — Discharge Instructions (Signed)
Knee Pain °Knee pain is a very common symptom and can have many causes. Knee pain often goes away when you follow your health care provider's instructions for relieving pain and discomfort at home. However, knee pain can develop into a condition that needs treatment. Some conditions may include: °· Arthritis caused by wear and tear (osteoarthritis). °· Arthritis caused by swelling and irritation (rheumatoid arthritis or gout). °· A cyst or growth in your knee. °· An infection in your knee joint. °· An injury that will not heal. °· Damage, swelling, or irritation of the tissues that support your knee (torn ligaments or tendinitis). °If your knee pain continues, additional tests may be ordered to diagnose your condition. Tests may include X-rays or other imaging studies of your knee. You may also need to have fluid removed from your knee. Treatment for ongoing knee pain depends on the cause, but treatment may include: °· Medicines to relieve pain or swelling. °· Steroid injections in your knee. °· Physical therapy. °· Surgery. °HOME CARE INSTRUCTIONS °· Take medicines only as directed by your health care provider. °· Rest your knee and keep it raised (elevated) while you are resting. °· Do not do things that cause or worsen pain. °· Avoid high-impact activities or exercises, such as running, jumping rope, or doing jumping jacks. °· Apply ice to the knee area: °¨ Put ice in a plastic bag. °¨ Place a towel between your skin and the bag. °¨ Leave the ice on for 20 minutes, 2-3 times a day. °· Ask your health care provider if you should wear an elastic knee support. °· Keep a pillow under your knee when you sleep. °· Lose weight if you are overweight. Extra weight can put pressure on your knee. °· Do not use any tobacco products, including cigarettes, chewing tobacco, or electronic cigarettes. If you need help quitting, ask your health care provider. Smoking may slow the healing of any bone and joint problems that you may  have. °SEEK MEDICAL CARE IF: °· Your knee pain continues, changes, or gets worse. °· You have a fever along with knee pain. °· Your knee buckles or locks up. °· Your knee becomes more swollen. °SEEK IMMEDIATE MEDICAL CARE IF:  °· Your knee joint feels hot to the touch. °· You have chest pain or trouble breathing. °  °This information is not intended to replace advice given to you by your health care provider. Make sure you discuss any questions you have with your health care provider. °  °Document Released: 12/17/2006 Document Revised: 03/12/2014 Document Reviewed: 10/05/2013 °Elsevier Interactive Patient Education ©2016 [employer] ° °Osteoarthritis  °Osteoarthritis is a disease that causes soreness and inflammation of a joint. It occurs when the cartilage at the affected joint wears down. Cartilage acts as a cushion, covering the ends of bones where they meet to form a joint. Osteoarthritis is the most common form of arthritis. It often occurs in older people. The joints affected most often by this condition include those in the:  °Ends of the fingers.  °Thumbs.  °Neck.  °Lower back.  °Knees.  °Hips. °CAUSES  °Over time, the cartilage that covers the ends of bones begins to wear away. This causes bone to rub on bone, producing pain and stiffness in the affected joints.  °RISK FACTORS  °Certain factors can increase your chances of having osteoarthritis, including:  °Older age.  °Excessive body weight.  °Overuse of joints.  °Previous joint injury. °SIGNS AND SYMPTOMS  °Pain, swelling, and stiffness   in the joint.  °Over time, the joint may lose its normal shape.  °Small deposits of bone (osteophytes) may grow on the edges of the joint.  °Bits of bone or cartilage can break off and float inside the joint space. This may cause more pain and damage. °DIAGNOSIS  °Your health care provider will do a physical exam and ask about your symptoms. Various tests may be ordered, such as:  °X-rays of the affected joint.  °Blood  tests to rule out other types of arthritis. °Additional tests may be used to diagnose your condition.  °TREATMENT  °Goals of treatment are to control pain and improve joint function. Treatment plans may include:  °A prescribed exercise program that allows for rest and joint relief.  °A weight control plan.  °Pain relief techniques, such as:  °Properly applied heat and cold.  °Electric pulses delivered to nerve endings under the skin (transcutaneous electrical nerve stimulation [TENS]).  °Massage.  °Certain nutritional supplements. °Medicines to control pain, such as:  °Acetaminophen.  °Nonsteroidal anti-inflammatory drugs (NSAIDs), such as naproxen.  °Narcotic or central-acting agents, such as tramadol.  °Corticosteroids. These can be given orally or as an injection. °Surgery to reposition the bones and relieve pain (osteotomy) or to remove loose pieces of bone and cartilage. Joint replacement may be needed in advanced states of osteoarthritis. °HOME CARE INSTRUCTIONS  °Take medicines only as directed by your health care provider.  °Maintain a healthy weight. Follow your health care provider's instructions for weight control. This may include dietary instructions.  °Exercise as directed. Your health care provider can recommend specific types of exercise. These may include:  °Strengthening exercises. These are done to strengthen the muscles that support joints affected by arthritis. They can be performed with weights or with exercise bands to add resistance.  °Aerobic activities. These are exercises, such as brisk walking or low-impact aerobics, that get your heart pumping.  °Range-of-motion activities. These keep your joints limber.  °Balance and agility exercises. These help you maintain daily living skills. °Rest your affected joints as directed by your health care provider.  °Keep all follow-up visits as directed by your health care provider. °SEEK MEDICAL CARE IF:  °Your skin turns red.  °You develop a rash in  addition to your joint pain.  °You have worsening joint pain.  °You have a fever along with joint or muscle aches. °SEEK IMMEDIATE MEDICAL CARE IF:  °You have a significant loss of weight or appetite.  °You have night sweats. °FOR MORE INFORMATION  °National Institute of Arthritis and Musculoskeletal and Skin Diseases: www.niams.nih.gov  °National Institute on Aging: www.nia.nih.gov  °American College of Rheumatology: www.rheumatology.org °This information is not intended to replace advice given to you by your health care provider. Make sure you discuss any questions you have with your health care provider.  °Document Released: 02/19/2005 Document Revised: 03/12/2014 Document Reviewed: 10/27/2012  °Elsevier Interactive Patient Education ©2016 [employer] Inc.  ° °

## 2015-08-17 NOTE — ED Provider Notes (Signed)
CSN: [id number]     Arrival date & time 08/17/15  1428 History   First MD Initiated Contact with Patient 08/17/15 1633     Chief Complaint  Patient presents with  . Knee Pain   (Consider location/radiation/quality/duration/timing/severity/associated sxs/prior Treatment) HPI Comments: 60 year old female works at the [employer] is complaining of left knee pain. She has chronic pain in that knee for several years. She has a history of left knee arthritis. Usually she wears a knee sleeve which helps with her pain but that has worn out she has not been wearing it recently and her knee pain has increased. She complains of mild swelling particularly to the medial aspect. Pain is better with rest and nonweightbearing. Pain is worse with weightbearing and increased ambulation. Denies any trauma or fall or any recent injury this event.   Past Medical History  Diagnosis Date  . Hypertension   . High cholesterol   . Diabetes mellitus without complication (HCC)    History reviewed. No pertinent past surgical history. History reviewed. No pertinent family history. Social History  Substance Use Topics  . Smoking status: Never Smoker   . Smokeless tobacco: None  . Alcohol Use: No   OB History    No data available     Review of Systems  Constitutional: Negative.  Negative for fever, chills and activity change.  HENT: Negative.   Respiratory: Negative.   Musculoskeletal: Positive for arthralgias and gait problem. Negative for myalgias.       As per HPI  Skin: Negative for color change, pallor and rash.  Neurological: Negative.   All other systems reviewed and are negative.   Allergies  Review of patient's allergies indicates no known allergies.  Home Medications   Prior to Admission medications   Medication Sig Start Date End Date Taking? Authorizing Provider  aspirin EC 81 MG tablet Take 81 mg by mouth daily.    Historical Provider, MD  glipiZIDE (GLUCOTROL XL) 10 MG 24 hr  tablet Take 10 mg by mouth 2 (two) times daily.    Historical Provider, MD  HYDROcodone-acetaminophen (NORCO/VICODIN) 5-325 MG per tablet Take 2 tablets by mouth every 4 (four) hours as needed for moderate pain. 09/11/13   Gilda Creasehristopher J Pollina, MD  lisinopril (PRINIVIL,ZESTRIL) 20 MG tablet Take 20 mg by mouth daily.    Historical Provider, MD  lithium carbonate (ESKALITH) 450 MG CR tablet Take 450 mg by mouth 2 (two) times daily.    Historical Provider, MD  meloxicam (MOBIC) 15 MG tablet Take 1 tablet (15 mg total) by mouth daily. Take with food 08/17/15   Hayden Rasmussenavid Bolden Hagerman, NP  pravastatin (PRAVACHOL) 40 MG tablet Take 40 mg by mouth daily.    Historical Provider, MD   Meds Ordered and Administered this Visit  Medications - No data to display  BP 129/85 mmHg  Pulse 75  Temp(Src) 98.6 F (37 C) (Oral)  Resp 16  SpO2 99% No data found.   Physical Exam  Constitutional: She is oriented to person, place, and time. She appears well-developed and well-nourished. No distress.  HENT:  Head: Normocephalic and atraumatic.  Eyes: EOM are normal.  Neck: Normal range of motion. Neck supple.  Cardiovascular: Normal rate.   Pulmonary/Chest: Effort normal.  Musculoskeletal: Normal range of motion.  Left knee with no deformity or discoloration. No increase in warmth. Minor swelling to the anteromedial aspect of the proximal knee. No tenderness at the joint line, patella, patellar ligaments, anterior or lateral ligaments. Negative drawer, negative  varus, negative valgus, no laxity appreciated. She exhibits full range of motion of the knee. No pain with any forced movement of the knee including internal and neck sternal rotation.  Neurological: She is alert and oriented to person, place, and time. No cranial nerve deficit.  Skin: Skin is warm and dry.  Psychiatric: She has a normal mood and affect.  Nursing note and vitals reviewed.   ED Course  Procedures (including critical care time)  Labs  Review Labs Reviewed - No data to display  Imaging Review No results found.   Visual Acuity Review  Right Eye Distance:   Left Eye Distance:   Bilateral Distance:    Right Eye Near:   Left Eye Near:    Bilateral Near:         MDM   1. Knee pain, chronic, left   2. Primary osteoarthritis of left knee    Limit ambulation as much as possible in the next 2 days. You may take the Mobic as directed for pain. Wear the knee sleeve at all times while working. Apply ice packs whenever you can to help with swelling and inflammation. If this is making it worse he may want to switch to heat. For increased swelling and pain or worsening may return or follow-up with your primary care doctor.    Hayden Rasmussen, NP 08/17/15 [phone]

## 2015-10-31 ENCOUNTER — Other Ambulatory Visit: Payer: Self-pay | Admitting: Family Medicine

## 2015-10-31 DIAGNOSIS — Z1231 Encounter for screening mammogram for malignant neoplasm of breast: Secondary | ICD-10-CM

## 2015-12-05 ENCOUNTER — Ambulatory Visit
Admission: RE | Admit: 2015-12-05 | Discharge: 2015-12-05 | Disposition: A | Discharge status: Home / Self Care | Payer: Commercial Managed Care - HMO | Source: Ambulatory Visit | Attending: Family Medicine | Admitting: Family Medicine

## 2015-12-05 DIAGNOSIS — Z1231 Encounter for screening mammogram for malignant neoplasm of breast: Secondary | ICD-10-CM

## 2016-04-24 ENCOUNTER — Observation Stay (HOSPITAL_COMMUNITY)
Admission: EM | Admit: 2016-04-24 | Discharge: 2016-04-25 | Disposition: A | Discharge status: Home / Self Care | Payer: Commercial Managed Care - HMO | Attending: Internal Medicine | Admitting: Internal Medicine

## 2016-04-24 ENCOUNTER — Encounter (HOSPITAL_COMMUNITY): Payer: Self-pay | Admitting: *Deleted

## 2016-04-24 ENCOUNTER — Emergency Department (HOSPITAL_COMMUNITY): Payer: Commercial Managed Care - HMO

## 2016-04-24 DIAGNOSIS — Z7984 Long term (current) use of oral hypoglycemic drugs: Secondary | ICD-10-CM | POA: Insufficient documentation

## 2016-04-24 DIAGNOSIS — R0789 Other chest pain: Secondary | ICD-10-CM | POA: Diagnosis not present

## 2016-04-24 DIAGNOSIS — R0602 Shortness of breath: Secondary | ICD-10-CM | POA: Diagnosis not present

## 2016-04-24 DIAGNOSIS — F319 Bipolar disorder, unspecified: Secondary | ICD-10-CM | POA: Insufficient documentation

## 2016-04-24 DIAGNOSIS — I10 Essential (primary) hypertension: Secondary | ICD-10-CM | POA: Diagnosis not present

## 2016-04-24 DIAGNOSIS — E119 Type 2 diabetes mellitus without complications: Secondary | ICD-10-CM

## 2016-04-24 DIAGNOSIS — R079 Chest pain, unspecified: Secondary | ICD-10-CM | POA: Diagnosis present

## 2016-04-24 DIAGNOSIS — Z7982 Long term (current) use of aspirin: Secondary | ICD-10-CM | POA: Insufficient documentation

## 2016-04-24 DIAGNOSIS — R072 Precordial pain: Secondary | ICD-10-CM | POA: Diagnosis not present

## 2016-04-24 DIAGNOSIS — E785 Hyperlipidemia, unspecified: Secondary | ICD-10-CM

## 2016-04-24 LAB — BASIC METABOLIC PANEL
Anion gap: 10 (ref 5–15)
BUN: 10 mg/dL (ref 6–20)
CALCIUM: 9.3 mg/dL (ref 8.9–10.3)
CO2: 23 mmol/L (ref 22–32)
CREATININE: 0.69 mg/dL (ref 0.44–1.00)
Chloride: 103 mmol/L (ref 101–111)
Glucose, Bld: 263 mg/dL — ABNORMAL HIGH (ref 65–99)
Potassium: 3.8 mmol/L (ref 3.5–5.1)
SODIUM: 136 mmol/L (ref 135–145)

## 2016-04-24 LAB — I-STAT TROPONIN, ED: TROPONIN I, POC: 0 ng/mL (ref 0.00–0.08)

## 2016-04-24 LAB — TROPONIN I
TROPONIN I: 0.03 ng/mL — AB (ref <0.03)
TROPONIN I: 0.03 ng/mL — AB (ref <0.03)

## 2016-04-24 LAB — CBC
HCT: 44.7 % (ref 36.0–46.0)
Hemoglobin: 14.4 g/dL (ref 12.0–15.0)
MCH: 28.9 pg (ref 26.0–34.0)
MCHC: 32.2 g/dL (ref 30.0–36.0)
MCV: 89.6 fL (ref 78.0–100.0)
PLATELETS: 222 10*3/uL (ref 150–400)
RBC: 4.99 MIL/uL (ref 3.87–5.11)
RDW: 13.7 % (ref 11.5–15.5)
WBC: 6 10*3/uL (ref 4.0–10.5)

## 2016-04-24 MED ORDER — ACETAMINOPHEN 325 MG PO TABS: 650.0000 mg | ORAL_TABLET | ORAL | Status: DC | PRN

## 2016-04-24 MED ORDER — ONDANSETRON HCL 4 MG/2ML IJ SOLN: 4.0000 mg | Freq: Four times a day (QID) | INTRAMUSCULAR | Status: DC | PRN

## 2016-04-24 MED ORDER — LISINOPRIL 20 MG PO TABS
20.0000 mg | ORAL_TABLET | Freq: Every day | ORAL | Status: DC
  Administered 2016-04-25: 20 mg via ORAL
  Filled 2016-04-24: qty 1

## 2016-04-24 MED ORDER — LITHIUM CARBONATE ER 450 MG PO TBCR
450.0000 mg | EXTENDED_RELEASE_TABLET | Freq: Two times a day (BID) | ORAL | Status: DC
  Administered 2016-04-24 – 2016-04-25 (×2): 450 mg via ORAL
  Filled 2016-04-24 (×3): qty 1

## 2016-04-24 MED ORDER — ASPIRIN 81 MG PO CHEW
324.0000 mg | CHEWABLE_TABLET | Freq: Once | ORAL | Status: AC
  Administered 2016-04-24: 324 mg via ORAL
  Filled 2016-04-24: qty 4

## 2016-04-24 MED ORDER — ENOXAPARIN SODIUM 40 MG/0.4ML ~~LOC~~ SOLN
40.0000 mg | SUBCUTANEOUS | Status: DC
  Administered 2016-04-24: 40 mg via SUBCUTANEOUS
  Filled 2016-04-24: qty 0.4

## 2016-04-24 MED ORDER — ASPIRIN EC 81 MG PO TBEC
81.0000 mg | DELAYED_RELEASE_TABLET | Freq: Every day | ORAL | Status: DC
  Administered 2016-04-25: 81 mg via ORAL
  Filled 2016-04-24: qty 1

## 2016-04-24 MED ORDER — PRAVASTATIN SODIUM 40 MG PO TABS
40.0000 mg | ORAL_TABLET | Freq: Every day | ORAL | Status: DC
  Administered 2016-04-25: 40 mg via ORAL
  Filled 2016-04-24: qty 1

## 2016-04-24 NOTE — ED Provider Notes (Signed)
[facility]-EMERGENCY DEPT Provider Note   CSN: [id number] Arrival date & time: 04/24/16  1330     History   Chief Complaint Chief Complaint  Patient presents with  . Chest Pain    HPI Amber Patterson is a 61 y.o. female.  The history is provided by the patient.  Chest Pain   This is a new problem. The current episode started yesterday. Episode frequency: intermittent. The problem has been resolved. The pain is associated with exertion. The pain is present in the substernal region. The pain is at a severity of 3/10. The pain is moderate. The quality of the pain is described as brief, heavy, sharp and pressure-like. The pain does not radiate. Duration of episode(s) is 2 minutes. Associated symptoms include shortness of breath. Pertinent negatives include no abdominal pain, no dizziness, no numbness, no vomiting and no weakness. She has tried nothing for the symptoms. The treatment provided no relief.  Her past medical history is significant for diabetes and hypertension.    Past Medical History:  Diagnosis Date  . Diabetes mellitus without complication (HCC)   . High cholesterol   . Hypertension     There are no active problems to display for this patient.   History reviewed. No pertinent surgical history.  OB History    No data available       Home Medications    Prior to Admission medications   Medication Sig Start Date End Date Taking? Authorizing Provider  aspirin EC 81 MG tablet Take 81 mg by mouth daily.    Historical Provider, MD  glipiZIDE (GLUCOTROL XL) 10 MG 24 hr tablet Take 10 mg by mouth 2 (two) times daily.    Historical Provider, MD  HYDROcodone-acetaminophen (NORCO/VICODIN) 5-325 MG per tablet Take 2 tablets by mouth every 4 (four) hours as needed for moderate pain. 09/11/13   Gilda Creasehristopher J Pollina, MD  lisinopril (PRINIVIL,ZESTRIL) 20 MG tablet Take 20 mg by mouth daily.    Historical Provider, MD  lithium carbonate (ESKALITH) 450 MG CR tablet Take 450 mg by  mouth 2 (two) times daily.    Historical Provider, MD  meloxicam (MOBIC) 15 MG tablet Take 1 tablet (15 mg total) by mouth daily. Take with food 08/17/15   Hayden Rasmussenavid Mabe, NP  pravastatin (PRAVACHOL) 40 MG tablet Take 40 mg by mouth daily.    Historical Provider, MD    Family History History reviewed. No pertinent family history.  Social History Social History  Substance Use Topics  . Smoking status: Never Smoker  . Smokeless tobacco: Not on file  . Alcohol use No     Allergies   Patient has no known allergies.   Review of Systems Review of Systems  Respiratory: Positive for shortness of breath.   Cardiovascular: Positive for chest pain.  Gastrointestinal: Negative for abdominal pain and vomiting.  Neurological: Negative for dizziness, weakness and numbness.  All other systems reviewed and are negative.    Physical Exam Updated Vital Signs BP 131/81   Pulse (!) 59   Temp 98.9 F (37.2 C) (Oral)   Resp 11   Ht 5' 1.5" (1.562 m)   Wt 130 lb (59 kg)   SpO2 99%   BMI 24.17 kg/m   Physical Exam  Constitutional: She is oriented to person, place, and time. She appears well-developed and well-nourished.  HENT:  Head: Normocephalic and atraumatic.  Eyes: Right eye exhibits no discharge.  Cardiovascular: Normal rate, regular rhythm and normal heart sounds.   No murmur  heard. Pulmonary/Chest: Effort normal and breath sounds normal. She has no wheezes. She has no rales.  Abdominal: Soft. She exhibits no distension. There is no tenderness.  Neurological: She is oriented to person, place, and time. No cranial nerve deficit.  Skin: Skin is warm and dry. She is not diaphoretic.  Psychiatric: She has a normal mood and affect.  Nursing note and vitals reviewed.    ED Treatments / Results  Labs (all labs ordered are listed, but only abnormal results are displayed) Labs Reviewed  BASIC METABOLIC PANEL - Abnormal; Notable for the following:       Result Value   Glucose, Bld  263 (*)    All other components within normal limits  CBC  I-STAT TROPOININ, ED    EKG  EKG Interpretation  Date/Time:  Tuesday April 24 2016 13:32:54 EST Ventricular Rate:  79 PR Interval:  128 QRS Duration: 72 QT Interval:  388 QTC Calculation: 444 R Axis:   29 Text Interpretation:  No significant change since last tracing Confirmed by Kandis Mannan ([id number]) on 04/24/2016 4:29:21 PM       Radiology Dg Chest 2 View  Result Date: 04/24/2016 CLINICAL DATA:  61 year old female with sharp chest pain for 3 days, pain radiating to the left arm. Initial encounter. EXAM: CHEST  2 VIEW COMPARISON:  Chest CTA 09/11/2013 and earlier. FINDINGS: Stable cardiac size at the upper limits of normal. Other mediastinal contours are within normal limits. Visualized tracheal air column is within normal limits. Stable lung volumes. No pneumothorax, pulmonary edema, pleural effusion or confluent pulmonary opacity. Negative visible bowel gas pattern. Stable mild thoracic scoliosis. No acute osseous abnormality identified. IMPRESSION: No acute cardiopulmonary abnormality. Electronically Signed   By: Odessa Fleming M.D.   On: 04/24/2016 14:30    Procedures Procedures (including critical care time)  Medications Ordered in ED Medications - No data to display   Initial Impression / Assessment and Plan / ED Course  I have reviewed the triage vital signs and the nursing notes.  Pertinent labs & imaging results that were available during my care of the patient were reviewed by me and considered in my medical decision making (see chart for details).     Pt is a 61 yo femael with Past medical history of hypertension hyperlipidemia diabetes. She never had any kind of risk stratification testing. Patient had sister that died at age 39 of an MI. Patient presented with intermittent pressure-like chest pain over the last day and half. She reports that it happened several times when she was exerting herself. As now  happened at rest. Concerned about unstable angina. Think it's reasonable to admit and be seen by cardiology for risk stratification. Patient's initial troponin and EKG are nonischemic.  Final Clinical Impressions(s) / ED Diagnoses   Final diagnoses:  None    New Prescriptions New Prescriptions   No medications on file     Demitrus Francisco Randall An, MD 04/24/16 1735

## 2016-04-24 NOTE — ED Triage Notes (Signed)
Pt reports mid chest pain since Sunday with nausea. Today pain started radiating down left arm. Denies sob. ekg done at triage. No acute distress noted.

## 2016-04-24 NOTE — Progress Notes (Signed)
CRITICAL VALUE ALERT  Critical value received:  Troponin 0.03  Date of notification:  04/24/2016  Time of notification:  2022  Critical value read back: yes  Nurse who received alert:  S. Primitivo GauzeFletcher  MD notified (1st page):  Schorr  Time of first page:  2048  MD notified (2nd page):  Time of second page:  Responding MD:    Time MD responded:

## 2016-04-24 NOTE — ED Notes (Signed)
Admitting at bedside 

## 2016-04-24 NOTE — H&P (Signed)
History and Physical    Amber Patterson ZOX:[id number] DOB: September 14, 1955 DOA: 04/24/2016  PCP: Pcp Not In System  Patient coming from: Home  Chief Complaint: Chest pain  HPI: Amber Patterson is a 61 y.o. female with medical history significant of hypertension, diabetes, hyperlipidemia, bipolar disorder who presents with multiple episodes of chest pain. She states that first episode started on Sunday when she was walking around [place]. She admits to having sharp right-sided chest pain that occurred, without any other associated symptoms. Chest pain then resolved after a few minutes spontaneously. This happened again about 4 times on Sunday, couple times on Monday, and again today. These episodes occurred the same way, sharp chest pain on the right, resolving spontaneously after a few minutes. She does have family history of coronary artery disease. Currently in the emergency department, patient is chest pain-free. She denies any recent illnesses, no fevers or chills, no current chest pain or shortness of breath, no nausea, vomiting, diarrhea or abdominal pain.   ED Course: Patient was given full dose of aspirin. Initial troponin is negative, initial EKG unremarkable. TRH called for observation.  Review of Systems: As per HPI otherwise 10 point review of systems negative.   Past Medical History:  Diagnosis Date  . Diabetes mellitus without complication (HCC)   . High cholesterol   . Hypertension     History reviewed. No pertinent surgical history.   reports that she has never smoked. She does not have any smokeless tobacco history on file. She reports that she does not drink alcohol or use drugs.  No Known Allergies  Family History  Problem Relation Age of Onset  . Hypertension Mother   . Heart attack Sister   . Cancer - Cervical Sister     Prior to Admission medications   Medication Sig Start Date End Date Taking? Authorizing Provider  aspirin EC 81 MG tablet Take 81 mg by mouth  daily.    Historical Provider, MD  glipiZIDE (GLUCOTROL XL) 10 MG 24 hr tablet Take 10 mg by mouth 2 (two) times daily.    Historical Provider, MD  HYDROcodone-acetaminophen (NORCO/VICODIN) 5-325 MG per tablet Take 2 tablets by mouth every 4 (four) hours as needed for moderate pain. 09/11/13   Gilda Crease, MD  lisinopril (PRINIVIL,ZESTRIL) 20 MG tablet Take 20 mg by mouth daily.    Historical Provider, MD  lithium carbonate (ESKALITH) 450 MG CR tablet Take 450 mg by mouth 2 (two) times daily.    Historical Provider, MD  meloxicam (MOBIC) 15 MG tablet Take 1 tablet (15 mg total) by mouth daily. Take with food 08/17/15   Hayden Rasmussen, NP  pravastatin (PRAVACHOL) 40 MG tablet Take 40 mg by mouth daily.    Historical Provider, MD    Physical Exam: Vitals:   04/24/16 1715 04/24/16 1730 04/24/16 1745 04/24/16 1800  BP: 133/79 133/87 119/72 141/94  Pulse: 64 61 62 60  Resp: 11 15 11 14   Temp:      TempSrc:      SpO2: 100% 99% 99% 99%  Weight:      Height:        Constitutional: NAD, calm, comfortable Eyes: PERRL, lids and conjunctivae normal ENMT: Mucous membranes are moist. Posterior pharynx clear of any exudate or lesions.Normal dentition.  Neck: normal, supple, no masses, no thyromegaly Respiratory: clear to auscultation bilaterally, no wheezing, no crackles. Normal respiratory effort. No accessory muscle use.  Cardiovascular: Regular rate and rhythm, no murmurs / rubs / gallops.  No extremity edema.  Abdomen: no tenderness, no masses palpated. No hepatosplenomegaly. Bowel sounds positive.  Musculoskeletal: no clubbing / cyanosis. No joint deformity upper and lower extremities. Good ROM, no contractures. Normal muscle tone.  Skin: no rashes, lesions, ulcers. No induration Neurologic: CN 2-12 grossly intact. Strength 5/5 in all 4.  Psychiatric: Normal judgment and insight. Alert and oriented x 3. Normal mood.   Labs on Admission: I have personally reviewed following labs and imaging  studies  CBC:  Recent Labs Lab 04/24/16 1343  WBC 6.0  HGB 14.4  HCT 44.7  MCV 89.6  PLT 222   Basic Metabolic Panel:  Recent Labs Lab 04/24/16 1343  NA 136  K 3.8  CL 103  CO2 23  GLUCOSE 263*  BUN 10  CREATININE 0.69  CALCIUM 9.3   GFR: Estimated Creatinine Clearance: 61.8 mL/min (by C-G formula based on SCr of 0.69 mg/dL). Liver Function Tests: No results for input(s): AST, ALT, ALKPHOS, BILITOT, PROT, ALBUMIN in the last 168 hours. No results for input(s): LIPASE, AMYLASE in the last 168 hours. No results for input(s): AMMONIA in the last 168 hours. Coagulation Profile: No results for input(s): INR, PROTIME in the last 168 hours. Cardiac Enzymes: No results for input(s): CKTOTAL, CKMB, CKMBINDEX, TROPONINI in the last 168 hours. BNP (last 3 results) No results for input(s): PROBNP in the last 8760 hours. HbA1C: No results for input(s): HGBA1C in the last 72 hours. CBG: No results for input(s): GLUCAP in the last 168 hours. Lipid Profile: No results for input(s): CHOL, HDL, LDLCALC, TRIG, CHOLHDL, LDLDIRECT in the last 72 hours. Thyroid Function Tests: No results for input(s): TSH, T4TOTAL, FREET4, T3FREE, THYROIDAB in the last 72 hours. Anemia Panel: No results for input(s): VITAMINB12, FOLATE, FERRITIN, TIBC, IRON, RETICCTPCT in the last 72 hours. Urine analysis:    Component Value Date/Time   LABSPEC 1.015 04/23/2013 0900   PHURINE 5.5 04/23/2013 0900   GLUCOSEU >=1000 (A) 04/23/2013 0900   HGBUR SMALL (A) 04/23/2013 0900   BILIRUBINUR NEGATIVE 04/23/2013 0900   KETONESUR NEGATIVE 04/23/2013 0900   PROTEINUR NEGATIVE 04/23/2013 0900   UROBILINOGEN 0.2 04/23/2013 0900   NITRITE NEGATIVE 04/23/2013 0900   LEUKOCYTESUR NEGATIVE 04/23/2013 0900   Sepsis Labs: !!!!!!!!!!!!!!!!!!!!!!!!!!!!!!!!!!!!!!!!!!!! @LABRCNTIP (procalcitonin:4,lacticidven:4) )No results found for this or any previous visit (from the past 240 hour(s)).   Radiological Exams on  Admission: Dg Chest 2 View  Result Date: 04/24/2016 CLINICAL DATA:  61 year old female with sharp chest pain for 3 days, pain radiating to the left arm. Initial encounter. EXAM: CHEST  2 VIEW COMPARISON:  Chest CTA 09/11/2013 and earlier. FINDINGS: Stable cardiac size at the upper limits of normal. Other mediastinal contours are within normal limits. Visualized tracheal air column is within normal limits. Stable lung volumes. No pneumothorax, pulmonary edema, pleural effusion or confluent pulmonary opacity. Negative visible bowel gas pattern. Stable mild thoracic scoliosis. No acute osseous abnormality identified. IMPRESSION: No acute cardiopulmonary abnormality. Electronically Signed   By: Odessa Fleming M.D.   On: 04/24/2016 14:30    EKG: Independently reviewed. Normal sinus rhythm with inferior T-wave inversions, these have been present since at least 2015. Overall EKG is unchanged since previous  Assessment/Plan Principal Problem:   Chest pain Active Problems:   HTN (hypertension)   HLD (hyperlipidemia)   DM (diabetes mellitus), type 2 (HCC)   Bipolar disease, chronic (HCC)   Atypical chest pain -Complains of sharp right-sided chest pain that is intermittent in nature. On chart review, patient was in the  ED on 09/11/2013, for intermittent right-sided chest pain. At that time, EKG and troponin were unremarkable and patient was discharged from the ED. -Heart Score 3  -Trend troponin -Repeat EKG in a.m. -Echocardiogram -Daily aspirin  Diabetes mellitus type 2 -Hold glipizide -Sliding scale insulin  Bipolar disorder -Continue Lithium  Essential hypertension -Continue lisinopril  Hyperlipidemia -Continue Pravachol   DVT prophylaxis: lovenox Code Status: full  Family Communication: at bedside Disposition Plan: pending further work up, possible discharge tomorrow if work up unremarkable and patient stable  Consults called: none  Admission status: observation   Noralee StainJennifer Shamina Etheridge,  DO [facility] [email or web address] Password [email or web address] 04/24/2016, 6:15 PM

## 2016-04-24 NOTE — ED Notes (Signed)
Attempted report x 1 pt will go to 14 instead of 10 per 3W

## 2016-04-25 ENCOUNTER — Other Ambulatory Visit: Payer: Self-pay

## 2016-04-25 ENCOUNTER — Observation Stay (HOSPITAL_BASED_OUTPATIENT_CLINIC_OR_DEPARTMENT_OTHER): Payer: Commercial Managed Care - HMO

## 2016-04-25 ENCOUNTER — Other Ambulatory Visit: Payer: Self-pay | Admitting: Physician Assistant

## 2016-04-25 ENCOUNTER — Encounter (HOSPITAL_COMMUNITY): Payer: Self-pay | Admitting: Internal Medicine

## 2016-04-25 DIAGNOSIS — I10 Essential (primary) hypertension: Secondary | ICD-10-CM | POA: Diagnosis not present

## 2016-04-25 DIAGNOSIS — F319 Bipolar disorder, unspecified: Secondary | ICD-10-CM

## 2016-04-25 DIAGNOSIS — R079 Chest pain, unspecified: Secondary | ICD-10-CM | POA: Diagnosis not present

## 2016-04-25 DIAGNOSIS — E119 Type 2 diabetes mellitus without complications: Secondary | ICD-10-CM | POA: Diagnosis not present

## 2016-04-25 DIAGNOSIS — R072 Precordial pain: Principal | ICD-10-CM

## 2016-04-25 LAB — TROPONIN I: Troponin I: 0.03 ng/mL (ref <0.03)

## 2016-04-25 LAB — ECHOCARDIOGRAM COMPLETE
Height: 61.5 in
Weight: 1956.8 oz

## 2016-04-25 LAB — GLUCOSE, CAPILLARY
Glucose-Capillary: 247 mg/dL — ABNORMAL HIGH (ref 65–99)
Glucose-Capillary: 214 mg/dL — ABNORMAL HIGH (ref 65–99)

## 2016-04-25 MED ORDER — INSULIN ASPART 100 UNIT/ML ~~LOC~~ SOLN: 0.0000 [IU] | Freq: Every day | SUBCUTANEOUS | Status: DC

## 2016-04-25 MED ORDER — INSULIN ASPART 100 UNIT/ML ~~LOC~~ SOLN
0.0000 [IU] | Freq: Three times a day (TID) | SUBCUTANEOUS | Status: DC
  Administered 2016-04-25 (×2): 5 [IU] via SUBCUTANEOUS

## 2016-04-25 NOTE — Consult Note (Signed)
Cardiology Consult    Patient ID: Amber Patterson MRN: [id number], DOB/AGE: 11/20/1955   Admit date: 04/24/2016 Date of Consult: 04/25/2016  Primary Physician: Pcp Not In System Primary Cardiologist: None Requesting Provider: Dr. Alvino Chapel  Reason for Consult: Chest pain  Patient Profile    61 y/o woman with a history of hypertension, diabetes, hyperlipidemia, and bipolar disorder who presented to the ED after multiple short episodes of chest pain.  Past Medical History   Past Medical History:  Diagnosis Date  . Diabetes mellitus without complication (HCC)   . High cholesterol   . Hypertension     History reviewed. No pertinent surgical history.   Allergies  No Known Allergies  History of Present Illness    She was feeling in usual health when symptoms started on Sunday 2/18 with right sided chest pain walking around [employer] that subsided after resting a few minutes. This pain occurred several times on Sunday, Monday, and again yesterday prompting her trip to the ED. She denies associated dyspnea, nausea, or diaphoresis. The pain was sharp. Her pain was resolved by her arrival to the ED. CXR and EKG were unremarkable. Initial troponin of 0.03 has remained flat.  She has not had similar pain any time recently. She works at the [employer] daily without significant dyspnea or chest pain. Her exertion is limited by left knee arthritis and she does not exercise regularly. Her sister died of a heart attack 2 years ago, and she denies other known family history of coronary disease. She is a former smoker many years ago without any active substance use. She has never had a stress test or heart catheterization.  Inpatient Medications    . aspirin EC  81 mg Oral Daily  . enoxaparin (LOVENOX) injection  40 mg Subcutaneous Q24H  . lisinopril  20 mg Oral Daily  . lithium carbonate  450 mg Oral BID  . pravastatin  40 mg Oral Daily     Outpatient Medications    Prior to  Admission medications   Medication Sig Start Date End Date Taking? Authorizing Provider  aspirin EC 81 MG tablet Take 81 mg by mouth daily.    Historical Provider, MD  glipiZIDE (GLUCOTROL XL) 10 MG 24 hr tablet Take 10 mg by mouth 2 (two) times daily.    Historical Provider, MD  HYDROcodone-acetaminophen (NORCO/VICODIN) 5-325 MG per tablet Take 2 tablets by mouth every 4 (four) hours as needed for moderate pain. 09/11/13   Gilda Crease, MD  lisinopril (PRINIVIL,ZESTRIL) 20 MG tablet Take 20 mg by mouth daily.    Historical Provider, MD  lithium carbonate (ESKALITH) 450 MG CR tablet Take 450 mg by mouth 2 (two) times daily.    Historical Provider, MD  meloxicam (MOBIC) 15 MG tablet Take 1 tablet (15 mg total) by mouth daily. Take with food 08/17/15   Hayden Rasmussen, NP  pravastatin (PRAVACHOL) 40 MG tablet Take 40 mg by mouth daily.    Historical Provider, MD     Family History    Family History  Problem Relation Age of Onset  . Hypertension Mother   . Heart attack Sister   . Cancer - Cervical Sister     Social History    Social History   Social History  . Marital status: Married    Spouse name: N/A  . Number of children: N/A  . Years of education: N/A   Occupational History  . Not on file.   Social History Main  Topics  . Smoking status: Former Smoker    Types: Cigarettes  . Smokeless tobacco: Never Used  . Alcohol use No  . Drug use: No  . Sexual activity: No   Other Topics Concern  . Not on file   Social History Narrative  . No narrative on file     Review of Systems    General:  No chills, fever, night sweats or weight changes.  Cardiovascular:  Chest pain, no dyspnea Dermatological: No rash, lesions/masses Respiratory: No cough, dyspnea Urologic: No hematuria, dysuria Abdominal:   No nausea, vomiting, diarrhea, bright red blood per rectum Neurologic:  No visual changes, changes in mental status. All other systems reviewed and are otherwise negative  except as noted above.  Physical Exam    Blood pressure 112/75, pulse 69, temperature 98.7 F (37.1 C), temperature source Oral, resp. rate 16, height 5' 1.5" (1.562 m), weight 122 lb 4.8 oz (55.5 kg), SpO2 97 %.  General: Pleasant, NAD Psych: Normal affect Neuro: Alert and oriented X 3. Moves all extremities spontaneously HEENT: Normal Neck: Supple without bruits or JVD Lungs:  Resp regular and unlabored, CTA Heart: RRR no s3, s4, or murmurs Abdomen: Soft, non-tender Extremities: No clubbing, cyanosis or edema. DP/Radials 2+ and equal bilaterally  Labs     Recent Labs  04/24/16 1902 04/24/16 2152 04/25/16 0024  TROPONINI 0.03* 0.03* 0.03*   Lab Results  Component Value Date   WBC 6.0 04/24/2016   HGB 14.4 04/24/2016   HCT 44.7 04/24/2016   MCV 89.6 04/24/2016   PLT 222 04/24/2016     Recent Labs Lab 04/24/16 1343  NA 136  K 3.8  CL 103  CO2 23  BUN 10  CREATININE 0.69  CALCIUM 9.3  GLUCOSE 263*     Radiology Studies    04/24/16 Dg Chest 2 View  EXAM: CHEST  2 VIEW  COMPARISON:  Chest CTA 09/11/2013 and earlier.  FINDINGS: Stable cardiac size at the upper limits of normal. Other mediastinal contours are within normal limits. Visualized tracheal air column is within normal limits. Stable lung volumes. No pneumothorax, pulmonary edema, pleural effusion or confluent pulmonary opacity. Negative visible bowel gas pattern. Stable mild thoracic scoliosis. No acute osseous abnormality identified.  IMPRESSION: No acute cardiopulmonary abnormality.   ECG & Cardiac Imaging    ECG 2/21 0600: Normal sinus rhythm, LAE, slight repolarization abnormality not significant changed from old tracings  Assessment & Plan    Chest pain: Her pain is intermittent and does not strongly correlating with exertion. Unstable angina can not be fully excluded from her history. Fortunately she remains symptom free at this time. She has risk factors including age, diabetes, and  hyperlipidemia/heart score 3. Borderline troponin x3 in the absence of any renal disease. -ASA 81mg  -On pravastatin 40mg  -Getting TTE today, if normal can d/c and will follow up in clinic, if significantly abnormal/low EF/focal defects would recommend cath  Left knee pain: Walks daily for work, probably not a barrier to [employer]  Bipolar disorder: She exhibits no ECG changes suggestive of lithium toxicity.  Hyperlipidemia: On statin  T2DM: Management per primary team   Fuller Plan, MD PGY-II Internal Medicine Resident Pager# [phone] 04/25/2016, 7:55 AM   I have seen and examined the patient along with Fuller Plan, MD.  I have reviewed the chart, notes and new data.  I agree with PA/NP's note.  Key new complaints: chest pain syndrome is highly atypical, but she has a fairly compelling risk  factor profile (first degree female relative with premature CAD/PAD, HTN, DM, former smoker). Key examination changes: no CHF by exam, no meaningful arrhythmia, no overt PAD Key new findings / data: abnormal ECG consistent with possible LAD ischemia (subtle ST elevation and inverted T waves, but the changes are identical to ECGs as far back as 2015, maybe even 2012 (though less pronounced in 2012). Borderline flat troponin trend is not c/w acute ischemia.  PLAN: Will review echo. If there are regional wall motion changes, would proceed with cardiac cath. Otherwise, plan outpatient nuclear stress test (plain ECG stress would not be appropriate due to baseline ECG changes).  Thurmon FairMihai Ginger Leeth, MD, [facility] [phone] 04/25/2016, 10:03 AM

## 2016-04-25 NOTE — Discharge Summary (Signed)
Physician Discharge Summary  Amber ReichertVanessa C Patterson WUJ:[id number] DOB: [birth year] DOA: 04/24/2016  PCP: Pcp Not In System  Admit date: 04/24/2016 Discharge date: 04/25/2016   Recommendations for Outpatient Follow-Up:   Outpatient stress test  Discharge Diagnosis:   Principal Problem:   Chest pain Active Problems:   HTN (hypertension)   HLD (hyperlipidemia)   DM (diabetes mellitus), type 2 (HCC)   Bipolar disease, chronic (HCC)   Discharge disposition:  Home.  SNF:  Discharge Condition: Improved.  Diet recommendation: Low sodium, heart healthy.  Wound care: None.   History of Present Illness:   Amber ReichertVanessa C Kirkendall is a 61 y.o. female with medical history significant of hypertension, diabetes, hyperlipidemia, bipolar disorder who presents with multiple episodes of chest pain. She states that first episode started on Sunday [place] [employer]. She admits to having sharp right-sided chest pain that occurred, without any other associated symptoms. Chest pain then resolved after a few minutes spontaneously. This happened again about 4 times on Sunday, couple times on Monday, and again today. These episodes occurred the same way, sharp chest pain on the right, resolving spontaneously after a few minutes. She does have family history of coronary artery disease. Currently in the emergency department, patient is chest pain-free. She denies any recent illnesses, no fevers or chills, no current chest pain or shortness of breath, no nausea, vomiting, diarrhea or abdominal pain.     Hospital Course by Problem:   Chest pain -echo: Normal LV size with EF 60-65%. Normal RV size and systolic   function. No significant valvular abnormalities. -outpateint stress test    Medical Consultants:    cards   Discharge Exam:   Vitals:   04/25/16 0821 04/25/16 1400  BP: 101/72 96/66  Pulse:  78  Resp:  18  Temp:  98.6 F (37 C)   Vitals:   04/24/16 2055 04/25/16 0500  04/25/16 0821 04/25/16 1400  BP: 95/68 112/75 101/72 96/66  Pulse: 77 69  78  Resp: 16 16  18   Temp: 98.7 F (37.1 C) 98.7 F (37.1 C)  98.6 F (37 C)  TempSrc: Oral Oral  Oral  SpO2: 97% 97%  96%  Weight:  55.5 kg (122 lb 4.8 oz)    Height:        Gen:  NAD   The results of significant diagnostics from this hospitalization (including imaging, microbiology, ancillary and laboratory) are listed below for reference.     Procedures and Diagnostic Studies:   Dg Chest 2 View  Result Date: 04/24/2016 CLINICAL DATA:  [age] year old female with sharp chest pain for 3 days, pain radiating to the left arm. Initial encounter. EXAM: CHEST  2 VIEW COMPARISON:  Chest CTA 09/11/2013 and earlier. FINDINGS: Stable cardiac size at the upper limits of normal. Other mediastinal contours are within normal limits. Visualized tracheal air column is within normal limits. Stable lung volumes. No pneumothorax, pulmonary edema, pleural effusion or confluent pulmonary opacity. Negative visible bowel gas pattern. Stable mild thoracic scoliosis. No acute osseous abnormality identified. IMPRESSION: No acute cardiopulmonary abnormality. Electronically Signed   By: Odessa FlemingH  Hall M.D.   On: 04/24/2016 14:30     Labs:   Basic Metabolic Panel:  Recent Labs Lab 04/24/16 1343  NA 136  K 3.8  CL 103  CO2 23  GLUCOSE 263*  BUN 10  CREATININE 0.69  CALCIUM 9.3   GFR Estimated Creatinine Clearance: 57.1 mL/min (by C-G formula based on SCr of 0.69 mg/dL). Liver Function  Tests: No results for input(s): AST, ALT, ALKPHOS, BILITOT, PROT, ALBUMIN in the last 168 hours. No results for input(s): LIPASE, AMYLASE in the last 168 hours. No results for input(s): AMMONIA in the last 168 hours. Coagulation profile No results for input(s): INR, PROTIME in the last 168 hours.  CBC:  Recent Labs Lab 04/24/16 1343  WBC 6.0  HGB 14.4  HCT 44.7  MCV 89.6  PLT 222   Cardiac Enzymes:  Recent Labs Lab 04/24/16 1902  04/24/16 2152 04/25/16 0024  TROPONINI 0.03* 0.03* 0.03*   BNP: Invalid input(s): POCBNP CBG:  Recent Labs Lab 04/25/16 1219 04/25/16 1612  GLUCAP 247* 214*   D-Dimer No results for input(s): DDIMER in the last 72 hours. Hgb A1c No results for input(s): HGBA1C in the last 72 hours. Lipid Profile No results for input(s): CHOL, HDL, LDLCALC, TRIG, CHOLHDL, LDLDIRECT in the last 72 hours. Thyroid function studies No results for input(s): TSH, T4TOTAL, T3FREE, THYROIDAB in the last 72 hours.  Invalid input(s): FREET3 Anemia work up No results for input(s): VITAMINB12, FOLATE, FERRITIN, TIBC, IRON, RETICCTPCT in the last 72 hours. Microbiology No results found for this or any previous visit (from the past 240 hour(s)).   Discharge Instructions:   Discharge Instructions    Diet - low sodium heart healthy    Complete by:  As directed    Discharge instructions    Complete by:  As directed    Outpatient stress test to be arranged   Increase activity slowly    Complete by:  As directed      Allergies as of 04/25/2016   No Known Allergies     Medication List    STOP taking these medications   HYDROcodone-acetaminophen 5-325 MG tablet Commonly known as:  NORCO/VICODIN   meloxicam 15 MG tablet Commonly known as:  MOBIC     TAKE these medications   aspirin EC 81 MG tablet Take 81 mg by mouth daily.   glipiZIDE 10 MG 24 hr tablet Commonly known as:  GLUCOTROL XL Take 10 mg by mouth 2 (two) times daily.   lisinopril 20 MG tablet Commonly known as:  PRINIVIL,ZESTRIL Take 20 mg by mouth daily.   lithium carbonate 450 MG CR tablet Commonly known as:  ESKALITH Take 450 mg by mouth 2 (two) times daily.   pioglitazone 30 MG tablet Commonly known as:  ACTOS Take 30 mg by mouth daily.   pravastatin 40 MG tablet Commonly known as:  PRAVACHOL Take 40 mg by mouth daily.      Follow-up Information    PCP 1 week Follow up.        [facility] Follow up.   Specialty:  Cardiology Why:  will call you to arrange outpatient stress test Contact information: [address], [address] [place] [address] [phone]           Time coordinating discharge: 35 min  Signed:  Myran Arcia Juanetta Gosling   [facility] 04/25/2016, 5:01 PM

## 2016-04-25 NOTE — Progress Notes (Signed)
Echo normal. Per review with MD, OK to dc home with OP stress test - this will be arranged. I have sent a message to our [facility] office's scheduler requesting a follow-up appointment, and our office will call the patient with this information. Also d/w Dr. Benjamine MolaVann.  Makenze Ellett PA-C

## 2016-04-25 NOTE — Progress Notes (Signed)
*  PRELIMINARY RESULTS* Echocardiogram 2D Echocardiogram has been performed.  Stacey DrainWhite, Diem Pagnotta J 04/25/2016, 12:50 PM

## 2016-04-26 LAB — HIV ANTIBODY (ROUTINE TESTING W REFLEX): HIV Screen 4th Generation wRfx: NONREACTIVE

## 2016-05-29 ENCOUNTER — Encounter (HOSPITAL_COMMUNITY): Payer: Self-pay | Admitting: Physician Assistant

## 2016-06-13 ENCOUNTER — Telehealth (HOSPITAL_COMMUNITY): Payer: Self-pay | Admitting: Physician Assistant

## 2016-06-13 NOTE — Telephone Encounter (Signed)
User: Amber Patterson Date/time: 05/29/2016 2:25 PM  Comment: Called pt and spoke with her ..she voiced that she was at work and does not get off until 4pm..I sent a letter..remove after 06/12/16 if not scheduled by then  Context: [place] Schedule Orders/Appt Requests Outcome: Completed  Phone number: [phone] Phone Type: Mobile  Comm. type: Telephone Call type: Outgoing  Contact: Rocky Crafts C Relation to patient: Self  Letter:

## 2016-11-19 ENCOUNTER — Encounter: Payer: Self-pay | Admitting: Family Medicine

## 2016-11-19 ENCOUNTER — Ambulatory Visit (INDEPENDENT_AMBULATORY_CARE_PROVIDER_SITE_OTHER): Payer: 59 | Admitting: Family Medicine

## 2016-11-19 VITALS — BP 132/82 | HR 88 | Temp 98.4°F | Resp 16 | Ht 61.5 in | Wt 116.8 lb

## 2016-11-19 DIAGNOSIS — N898 Other specified noninflammatory disorders of vagina: Secondary | ICD-10-CM | POA: Diagnosis not present

## 2016-11-19 DIAGNOSIS — M1712 Unilateral primary osteoarthritis, left knee: Secondary | ICD-10-CM | POA: Diagnosis not present

## 2016-11-19 DIAGNOSIS — E119 Type 2 diabetes mellitus without complications: Secondary | ICD-10-CM | POA: Diagnosis not present

## 2016-11-19 DIAGNOSIS — M1990 Unspecified osteoarthritis, unspecified site: Secondary | ICD-10-CM | POA: Insufficient documentation

## 2016-11-19 DIAGNOSIS — Z5181 Encounter for therapeutic drug level monitoring: Secondary | ICD-10-CM | POA: Diagnosis not present

## 2016-11-19 DIAGNOSIS — F3181 Bipolar II disorder: Secondary | ICD-10-CM

## 2016-11-19 DIAGNOSIS — Z7689 Persons encountering health services in other specified circumstances: Secondary | ICD-10-CM

## 2016-11-19 DIAGNOSIS — E782 Mixed hyperlipidemia: Secondary | ICD-10-CM | POA: Diagnosis not present

## 2016-11-19 DIAGNOSIS — Z760 Encounter for issue of repeat prescription: Secondary | ICD-10-CM

## 2016-11-19 LAB — POCT WET + KOH PREP
TRICH BY WET PREP: ABSENT
YEAST BY KOH: ABSENT
Yeast by wet prep: ABSENT

## 2016-11-19 LAB — POCT GLYCOSYLATED HEMOGLOBIN (HGB A1C): Hemoglobin A1C: >14

## 2016-11-19 MED ORDER — LISINOPRIL 20 MG PO TABS: 20.0000 mg | ORAL_TABLET | Freq: Every day | ORAL | 1 refills | Status: DC

## 2016-11-19 MED ORDER — PRAVASTATIN SODIUM 40 MG PO TABS: 40.0000 mg | ORAL_TABLET | Freq: Every day | ORAL | 1 refills | Status: DC

## 2016-11-19 MED ORDER — GLIPIZIDE ER 10 MG PO TB24: 10.0000 mg | ORAL_TABLET | Freq: Two times a day (BID) | ORAL | 1 refills | Status: DC

## 2016-11-19 MED ORDER — PIOGLITAZONE HCL 30 MG PO TABS: 30.0000 mg | ORAL_TABLET | Freq: Every day | ORAL | 1 refills | Status: DC

## 2016-11-19 MED ORDER — LITHIUM CARBONATE ER 450 MG PO TBCR: 450.0000 mg | EXTENDED_RELEASE_TABLET | Freq: Two times a day (BID) | ORAL | 1 refills | Status: DC

## 2016-11-19 NOTE — Progress Notes (Signed)
Chief Complaint  Patient presents with  . Establish Care  . Medication Refill    see All  . Vaginal Itching    x 1 week    HPI  Uncontrolled Diabetes Pt ran out of her diabetes medications She was stretching the pills to make them last until she could establish care She does not check her blood glucose daily at home She reports that she is on glipizide and actos  She denies side effects of the glipizide and actos She reports that she  She does not stick to a diabetic diet She ate a gravy and sausage from [employer] this morning She typically eats cereals or sandwiches or ham biscuits for breakfast She eats a snack and has a meal replacement shake She has been thirsty all the time and urinating all the time.  She reports that her former PCP gave her a pneumonia vaccine 2 years ago She reports that she gets a yearly flu vaccine She has not had any eye exam  Former smoker She is a former smoker and has quit over 10 year ago She quit in her early 69s She also reports that she   Bipolar Disorder She reports that she has been out of the lithium as well and has been stretching it She reports that she only has 4 pills left of the lithium.  She denies severe depression or mania. Depression screen PHQ 2/9 11/19/2016  Decreased Interest 0  Down, Depressed, Hopeless 0  PHQ - 2 Score 0   Vaginal Discharge She reports that she has vaginal irritation She is not sure if this is a yeast infection but reports that she has been urinating 4 times at night and all day long She denies itching in the vagina.  Past Medical History:  Diagnosis Date  . Diabetes mellitus without complication (HCC)   . High cholesterol   . Hypertension     Current Outpatient Prescriptions  Medication Sig Dispense Refill  . aspirin EC 81 MG tablet Take 81 mg by mouth daily.    Marland Kitchen glipiZIDE (GLUCOTROL XL) 10 MG 24 hr tablet Take 1 tablet (10 mg total) by mouth 2 (two) times daily. 60 tablet 1  . lisinopril  (PRINIVIL,ZESTRIL) 20 MG tablet Take 1 tablet (20 mg total) by mouth daily. 30 tablet 1  . lithium carbonate (ESKALITH) 450 MG CR tablet Take 1 tablet (450 mg total) by mouth 2 (two) times daily. 60 tablet 1  . pioglitazone (ACTOS) 30 MG tablet Take 1 tablet (30 mg total) by mouth daily. 30 tablet 1  . pravastatin (PRAVACHOL) 40 MG tablet Take 1 tablet (40 mg total) by mouth daily. 30 tablet 1   No current facility-administered medications for this visit.     Allergies: No Known Allergies  No past surgical history on file.  Social History   Social History  . Marital status: Married    Spouse name: N/A  . Number of children: N/A  . Years of education: N/A   Social History Main Topics  . Smoking status: Former Smoker    Types: Cigarettes  . Smokeless tobacco: Never Used  . Alcohol use No  . Drug use: No  . Sexual activity: No   Other Topics Concern  . None   Social History Narrative  . None    ROS Review of Systems See HPI Constitution: No fevers or chills No malaise No diaphoresis Skin: No rash or itching Eyes: no blurry vision, no double vision GU: no dysuria or  hematuria Neuro: no dizziness or headaches  Objective: Vitals:   11/19/16 1323  BP: 132/82  Pulse: 88  Resp: 16  Temp: 98.4 F (36.9 C)  TempSrc: Oral  SpO2: 98%  Weight: 116 lb 12.8 oz (53 kg)  Height: 5' 1.5" (1.562 m)   Diabetic Foot Exam - Simple   Simple Foot Form Diabetic Foot exam was performed with the following findings:  Yes 11/19/2016  2:01 PM  Visual Inspection No deformities, no ulcerations, no other skin breakdown bilaterally:  Yes Sensation Testing Intact to touch and monofilament testing bilaterally:  Yes Pulse Check Posterior Tibialis and Dorsalis pulse intact bilaterally:  Yes Comments     Physical Exam  Constitutional: She is oriented to person, place, and time. She appears well-developed and well-nourished.  HENT:  Head: Normocephalic and atraumatic.  Eyes:  Conjunctivae and EOM are normal.  Neck: Normal range of motion. No thyromegaly present.  Cardiovascular: Normal rate, regular rhythm and normal heart sounds.   No murmur heard. Pulmonary/Chest: Effort normal and breath sounds normal. No respiratory distress. She has no wheezes.  Abdominal: Soft. Bowel sounds are normal. She exhibits no distension. There is no tenderness. There is no guarding.  Neurological: She is alert and oriented to person, place, and time.  Psychiatric: She has a normal mood and affect. Her behavior is normal. Judgment and thought content normal.    Assessment and Plan Amber Patterson was seen today for establish care, medication refill and vaginal itching.  Diagnoses and all orders for this visit:  Type 2 diabetes mellitus without complication, without long-term current use of insulin (HCC) -     Cancel: POCT glycosylated hemoglobin (Hb A1C) -     Microalbumin, urine -     Hemoglobin A1c -     POCT glycosylated hemoglobin (Hb A1C)  Will verify a1c >14 with send out lab Will restart meds actos and glipizide Pt not adhering to diabetic diet Will plan to titrate medication up and to discuss barriers such as cost and close follow up Plan for follow up in 2 weeks -     lisinopril (PRINIVIL,ZESTRIL) 20 MG tablet; Take 1 tablet (20 mg total) by mouth daily. -     glipiZIDE (GLUCOTROL XL) 10 MG 24 hr tablet; Take 1 tablet (10 mg total) by mouth 2 (two) times daily. -     pioglitazone (ACTOS) 30 MG tablet; Take 1 tablet (30 mg total) by mouth daily.  Primary osteoarthritis of left knee- will check renal function and talk about pain mgmt next visit nsaids vs. Referral to Ortho  Mixed hyperlipidemia- will resume pravachol -     Lipid panel -     Comprehensive metabolic panel -     TSH -     pravastatin (PRAVACHOL) 40 MG tablet; Take 1 tablet (40 mg total) by mouth daily.  Encounter for medication monitoring -     Cancel: Hemoglobin A1c -     Comprehensive metabolic  panel  Bipolar 2 disorder (HCC)-  Will refill lithium Will check tsh -     lithium carbonate (ESKALITH) 450 MG CR tablet; Take 1 tablet (450 mg total) by mouth 2 (two) times daily.  Vaginal discharge- likely due to uncontrolled diabetes No infection  -     POCT Wet + KOH Prep  Encounter to establish care Encounter for medication refill  Pt will get vaccines next visit. Clinic moved vaccines due to inclement weather -     Cancel: Pneumococcal conjugate vaccine 13-valent IM -  Cancel: Tdap vaccine greater than or equal to 7yo IM -     Cancel: Flu Vaccine QUAD 36+ mos IM    A total of 45 minutes were spent face-to-face with the patient during this encounter and over half of that time was spent on counseling and coordination of care.

## 2016-11-19 NOTE — Patient Instructions (Addendum)
IF you received an x-ray today, you will receive an invoice from [facility]. Please contact [facility] at [phone] with questions or concerns regarding your invoice.   IF you received labwork today, you will receive an invoice from [facility]. Please contact [facility] at [phone] with questions or concerns regarding your invoice.   Our billing staff will not be able to assist you with questions regarding bills from these companies.  You will be contacted with the lab results as soon as they are available. The fastest way to get your results is to activate your My Chart account. Instructions are located on the last page of this paperwork. If you have not heard from [place] regarding the results in 2 weeks, please contact this office.     Type 2 Diabetes Mellitus, Self Care, Adult Caring for yourself after you have been diagnosed with type 2 diabetes (type 2 diabetes mellitus) means keeping your blood sugar (glucose) under control with a balance of:  Nutrition.  Exercise.  Lifestyle changes.  Medicines or insulin, if necessary.  Support from your team of health care providers and others.  The following information explains what you need to know to manage your diabetes at home. What do I need to do to manage my blood glucose?  Check your blood glucose every day, as often as told by your health care provider.  Contact your health care provider if your blood glucose is above your target for 2 tests in a row.  Have your A1c (hemoglobin A1c) level checked at least two times a year, or as often as told by your health care provider. Your health care provider will set individualized treatment goals for you. Generally, the goal of treatment is to maintain the following blood glucose levels:  Before meals (preprandial): 80-130 mg/dL (4.4-7.2 mmol/L).  After meals (postprandial): below 180 mg/dL (10 mmol/L).  A1c level: less than 7%.  What do I need to know about  hyperglycemia and hypoglycemia? What is hyperglycemia? Hyperglycemia, also called high blood glucose, occurs when blood glucose is too high.Make sure you know the early signs of hyperglycemia, such as:  Increased thirst.  Hunger.  Feeling very tired.  Needing to urinate more often than usual.  Blurry vision.  What is hypoglycemia? Hypoglycemia, also called low blood glucose, occurswith a blood glucose level at or below 70 mg/dL (3.9 mmol/L). The risk for hypoglycemia increases during or after exercise, during sleep, during illness, and when skipping meals or not eating for a long time (fasting). It is important to know the symptoms of hypoglycemia and treat it right away. Always have a 15-gram rapid-acting carbohydrate snack with you to treat low blood glucose. Family members and close friends should also know the symptoms and should understand how to treat hypoglycemia, in case you are not able to treat yourself. What are the symptoms of hypoglycemia? Hypoglycemia symptoms can include:  Hunger.  Anxiety.  Sweating and feeling clammy.  Confusion.  Dizziness or feeling light-headed.  Sleepiness.  Nausea.  Increased heart rate.  Headache.  Blurry vision.  Seizure.  Nightmares.  Tingling or numbness around the mouth, lips, or tongue.  A change in speech.  Decreased ability to concentrate.  A change in coordination.  Restless sleep.  Tremors or shakes.  Fainting.  Irritability.  How do I treat hypoglycemia?  If you are alert and able to swallow safely, follow the 15:15 rule:  Take 15 grams of a rapid-acting carbohydrate. Rapid-acting options include: ? 1 tube  of glucose gel. ? 3 glucose pills. ? 6-8 pieces of hard candy. ? 4 oz (120 mL) of fruit juice. ? 4 oz (120 mL) of regular (not diet) soda.  Check your blood glucose 15 minutes after you take the carbohydrate.  If the repeat blood glucose level is still at or below 70 mg/dL (3.9 mmol/L), take  15 grams of a carbohydrate again.  If your blood glucose level does not increase above 70 mg/dL (3.9 mmol/L) after 3 tries, seek emergency medical care.  After your blood glucose level returns to normal, eat a meal or a snack within 1 hour.  How do I treat severe hypoglycemia? Severe hypoglycemia is when your blood glucose level is at or below 54 mg/dL (3 mmol/L). Severe hypoglycemia is an emergency. Do not wait to see if the symptoms will go away. Get medical help right away. Call your local emergency services (911 in the [place] Do not drive yourself to the hospital. If you have severe hypoglycemia and you cannot eat or drink, you may need an injection of glucagon. A family member or close friend should learn how to check your blood glucose and how to give you a glucagon injection. Ask your health care provider if you need to have an emergency glucagon injection kit available. Severe hypoglycemia may need to be treated in a hospital. The treatment may include getting glucose through an IV tube. You may also need treatment for the cause of your hypoglycemia. Can having diabetes put me at risk for other conditions? Having diabetes can put you at risk for other long-term (chronic) conditions, such as heart disease and kidney disease. Your health care provider may prescribe medicines to help prevent complications from diabetes. These medicines may include:  Aspirin.  Medicine to lower cholesterol.  Medicine to control blood pressure.  What else can I do to manage my diabetes? Take your diabetes medicines as told  If your health care provider prescribed insulin or diabetes medicines, take them every day.  Do not run out of insulin or other diabetes medicines that you take. Plan ahead so you always have these available.  If you use insulin, adjust your dosage based on how physically active you are and what foods you eat. Your health care provider will tell you how to adjust your dosage. Make  healthy food choices  The things that you eat and drink affect your blood glucose and your insulin dosage. Making good choices helps to control your diabetes and prevent other health problems. A healthy meal plan includes eating lean proteins, complex carbohydrates, fresh fruits and vegetables, low-fat dairy products, and healthy fats. Make an appointment to see a diet and nutrition specialist (registered dietitian) to help you create an eating plan that is right for you. Make sure that you:  Follow instructions from your health care provider about eating or drinking restrictions.  Drink enough fluid to keep your urine clear or pale yellow.  Eat healthy snacks between nutritious meals.  Track the carbohydrates that you eat. Do this by reading food labels and learning the standard serving sizes of foods.  Follow your sick day plan whenever you cannot eat or drink as usual. Make this plan in advance with your health care provider.  Stay active  Exercise regularly, as told by your health care provider. This may include:  Stretching and doing strength exercises, such as yoga or weightlifting, at least 2 times a week.  Doing at least 150 minutes of moderate-intensity or vigorous-intensity exercise  each week. This could be brisk walking, biking, or water aerobics. ? Spread out your activity over at least 3 days of the week. ? Do not go more than 2 days in a row without doing some kind of physical activity.  When you start a new exercise or activity, work with your health care provider to adjust your insulin, medicines, or food intake as needed. Make healthy lifestyle choices  Do not use any tobacco products, such as cigarettes, chewing tobacco, and e-cigarettes. If you need help quitting, ask your health care provider.  If your health care provider says that alcohol is safe for you, limit alcohol intake to no more than 1 drink per day for nonpregnant women and 2 drinks per day for men. One  drink equals 12 oz of beer, 5 oz of wine, or 1 oz of hard liquor.  Learn to manage stress. If you need help with this, ask your health care provider. Care for your body   Keep your immunizations up to date. In addition to getting vaccinations as told by your health care provider, it is recommended that you get vaccinated against the following illnesses: ? The flu (influenza). Get a flu shot every year. ? Pneumonia. ? Hepatitis B.  Schedule an eye exam soon after your diagnosis, and then one time every year after that.  Check your skin and feet every day for cuts, bruises, redness, blisters, or sores. Schedule a foot exam with your health care provider once every year.  Brush your teeth and gums two times a day, and floss at least one time a day. Visit your dentist at least once every 6 months.  Maintain a healthy weight. General instructions  Take over-the-counter and prescription medicines only as told by your health care provider.  Share your diabetes management plan with people in your workplace, school, and household.  Check your urine for ketones when you are ill and as told by your health care provider.  Ask your health care provider: ? Do I need to meet with a diabetes educator? ? Where can I find a support group for people with diabetes?  Carry a medical alert card or wear medical alert jewelry.  Keep all follow-up visits as told by your health care provider. This is important. Where to find more information: For more information about diabetes, visit:  American Diabetes Association (ADA): www.diabetes.org  American Association of Diabetes Educators (AADE): www.diabeteseducator.org/patient-resources  This information is not intended to replace advice given to you by your health care provider. Make sure you discuss any questions you have with your health care provider. Document Released: 06/13/2015 Document Revised: 07/28/2015 Document Reviewed: 03/25/2015 Elsevier  Interactive Patient Education  2017 [employer].

## 2016-11-20 LAB — COMPREHENSIVE METABOLIC PANEL
A/G RATIO: 1.5 (ref 1.2–2.2)
ALBUMIN: 4.4 g/dL (ref 3.6–4.8)
ALT: 13 IU/L (ref 0–32)
AST: 15 IU/L (ref 0–40)
Alkaline Phosphatase: 84 IU/L (ref 39–117)
BILIRUBIN TOTAL: 0.6 mg/dL (ref 0.0–1.2)
BUN / CREAT RATIO: 10 — AB (ref 12–28)
BUN: 8 mg/dL (ref 8–27)
CHLORIDE: 97 mmol/L (ref 96–106)
CO2: 23 mmol/L (ref 20–29)
Calcium: 9.5 mg/dL (ref 8.7–10.3)
Creatinine, Ser: 0.78 mg/dL (ref 0.57–1.00)
GFR calc non Af Amer: 82 mL/min/{1.73_m2} (ref >59)
GFR, EST AFRICAN AMERICAN: 95 mL/min/{1.73_m2} (ref >59)
GLOBULIN, TOTAL: 3 g/dL (ref 1.5–4.5)
Glucose: 379 mg/dL — ABNORMAL HIGH (ref 65–99)
POTASSIUM: 4.1 mmol/L (ref 3.5–5.2)
SODIUM: 135 mmol/L (ref 134–144)
TOTAL PROTEIN: 7.4 g/dL (ref 6.0–8.5)

## 2016-11-20 LAB — LIPID PANEL
CHOLESTEROL TOTAL: 228 mg/dL — AB (ref 100–199)
Chol/HDL Ratio: 3.7 ratio (ref 0.0–4.4)
HDL: 61 mg/dL (ref >39)
LDL Calculated: 121 mg/dL — ABNORMAL HIGH (ref 0–99)
TRIGLYCERIDES: 229 mg/dL — AB (ref 0–149)
VLDL CHOLESTEROL CAL: 46 mg/dL — AB (ref 5–40)

## 2016-11-20 LAB — TSH: TSH: 0.992 u[IU]/mL (ref 0.450–4.500)

## 2016-11-23 LAB — HEMOGLOBIN A1C
Est. average glucose Bld gHb Est-mCnc: 378 mg/dL
Hgb A1c MFr Bld: 14.8 % — ABNORMAL HIGH (ref 4.8–5.6)

## 2016-11-23 LAB — MICROALBUMIN, URINE: Microalbumin, Urine: <3 ug/mL

## 2016-12-03 ENCOUNTER — Encounter: Payer: Self-pay | Admitting: Family Medicine

## 2016-12-03 ENCOUNTER — Ambulatory Visit (INDEPENDENT_AMBULATORY_CARE_PROVIDER_SITE_OTHER): Payer: 59 | Admitting: Family Medicine

## 2016-12-03 VITALS — BP 110/66 | HR 74 | Temp 98.6°F | Resp 16 | Ht 61.5 in | Wt 122.0 lb

## 2016-12-03 DIAGNOSIS — E782 Mixed hyperlipidemia: Secondary | ICD-10-CM | POA: Diagnosis not present

## 2016-12-03 DIAGNOSIS — Z5181 Encounter for therapeutic drug level monitoring: Secondary | ICD-10-CM

## 2016-12-03 DIAGNOSIS — E1165 Type 2 diabetes mellitus with hyperglycemia: Secondary | ICD-10-CM

## 2016-12-03 DIAGNOSIS — R739 Hyperglycemia, unspecified: Secondary | ICD-10-CM

## 2016-12-03 DIAGNOSIS — I10 Essential (primary) hypertension: Secondary | ICD-10-CM | POA: Diagnosis not present

## 2016-12-03 LAB — GLUCOSE, POCT (MANUAL RESULT ENTRY): POC Glucose: 354 mg/dl — AB (ref 70–99)

## 2016-12-03 NOTE — Progress Notes (Signed)
Chief Complaint  Patient presents with  . Diabetes    recheck    HPI   Diabetes- uncontrolled Pt was restarted on her diabetes medications Glipizide and Actos She reports less urination, thirst and hunger She states that she has been eating more fruits and vegetables This morning she ate Amber gravy biscuit for breakfast She does not check her sugars at home She will get the flu shot at home   Hypertension: Patient here for follow-up of elevated blood pressure. She is not exercising and is adherent to low salt diet.  Blood pressure is well controlled at home. Cardiac symptoms none. Patient denies chest pain, dyspnea, fatigue, irregular heart beat and palpitations.  Cardiovascular risk factors: diabetes mellitus, dyslipidemia and hypertension. Use of agents associated with hypertension: none. History of target organ damage: none. BP Readings from Last 3 Encounters:  12/03/16 110/66  11/19/16 132/82  04/25/16 96/66    Hyperlipidemia Reviewed her labs with her  She reports that she is trying to eat better  She has not been eating as much fried foods but admits that she eats microwave dinners and frozen foods Lab Results  Component Value Date   CHOL 228 (H) 11/19/2016   HDL 61 11/19/2016   LDLCALC 121 (H) 11/19/2016   TRIG 229 (H) 11/19/2016   CHOLHDL 3.7 11/19/2016     Past Medical History:  Diagnosis Date  . Diabetes mellitus without complication (HCC)   . High cholesterol   . Hypertension     Current Outpatient Prescriptions  Medication Sig Dispense Refill  . aspirin EC 81 MG tablet Take 81 mg by mouth daily.    Marland Kitchen glipiZIDE (GLUCOTROL XL) 10 MG 24 hr tablet Take 1 tablet (10 mg total) by mouth 2 (two) times daily. 60 tablet 1  . lisinopril (PRINIVIL,ZESTRIL) 20 MG tablet Take 1 tablet (20 mg total) by mouth daily. 30 tablet 1  . lithium carbonate (ESKALITH) 450 MG CR tablet Take 1 tablet (450 mg total) by mouth 2 (two) times daily. 60 tablet 1  . pioglitazone (ACTOS)  30 MG tablet Take 1 tablet (30 mg total) by mouth daily. 30 tablet 1  . pravastatin (PRAVACHOL) 40 MG tablet Take 1 tablet (40 mg total) by mouth daily. 30 tablet 1   No current facility-administered medications for this visit.     Allergies: No Known Allergies  No past surgical history on file.  Social History   Social History  . Marital status: Married    Spouse name: N/Amber  . Number of children: N/Amber  . Years of education: N/Amber   Social History Main Topics  . Smoking status: Former Smoker    Types: Cigarettes  . Smokeless tobacco: Never Used  . Alcohol use No  . Drug use: No  . Sexual activity: No   Other Topics Concern  . None   Social History Narrative  . None    Review of Systems  Constitutional: Negative for chills, fever and malaise/fatigue.  Eyes: Negative for blurred vision and double vision.  Gastrointestinal: Negative for abdominal pain, nausea and vomiting.  Neurological: Negative for dizziness, tingling and headaches.    Objective: Vitals:   12/03/16 1015  BP: 110/66  Pulse: 74  Resp: 16  Temp: 98.6 F (37 C)  TempSrc: Oral  SpO2: 93%  Weight: 122 lb (55.3 kg)  Height: 5' 1.5" (1.562 m)    Physical Exam  Constitutional: She is oriented to person, place, and time. She appears well-developed and well-nourished.  HENT:  Head: Normocephalic and atraumatic.  Eyes: Conjunctivae and EOM are normal.  Cardiovascular: Normal rate, regular rhythm and normal heart sounds.   Pulmonary/Chest: Effort normal and breath sounds normal. No respiratory distress. She has no wheezes.  Neurological: She is alert and oriented to person, place, and time.  Skin: Skin is warm. Capillary refill takes less than 2 seconds.  Psychiatric: She has Amber normal mood and affect. Her behavior is normal. Judgment and thought content normal.    Assessment and Plan Amber Patterson was seen today for diabetes.  Diagnoses and all orders for this visit:  Uncontrolled type 2 diabetes  mellitus with hyperglycemia (HCC)- too soon to check a1c Restarted diabetes meds last visit -     POCT glucose (manual entry)  Hyperglycemia- pt still eating very starchy foods and high glycemic index fruits Discussed some changes but she believes the medications will correct it rather than changing the diet  Encounter for medication monitoring- reviewed changes to diabetes now that med has been started No change in her glucose as yet but  Her symptoms are improving Will recheck in one month  Hypertension - well controlled on lisinopril Hyperlipidemia- add fish oil, increase fiber  Other orders -     Cancel: Ambulatory referral to Gastroenterology -     Cancel: Flu Vaccine QUAD 36+ mos IM -     Cancel: Tdap vaccine greater than or equal to 7yo IM -     Cancel: Pneumococcal conjugate vaccine 13-valent IM -     Cancel: POCT glycosylated hemoglobin (Hb A1C)     Amber Patterson Amber Patterson

## 2016-12-03 NOTE — Patient Instructions (Addendum)
   IF you received an x-ray today, you will receive an invoice from [facility]. Please contact [facility] at [phone] with questions or concerns regarding your invoice.   IF you received labwork today, you will receive an invoice from [facility]. Please contact [facility] at [phone] with questions or concerns regarding your invoice.   Our billing staff will not be able to assist you with questions regarding bills from these companies.  You will be contacted with the lab results as soon as they are available. The fastest way to get your results is to activate your My Chart account. Instructions are located on the last page of this paperwork. If you have not heard from us regarding the results in 2 weeks, please contact this office.     Diabetes Mellitus and Food It is important for you to manage your blood sugar (glucose) level. Your blood glucose level can be greatly affected by what you eat. Eating healthier foods in the appropriate amounts throughout the day at about the same time each day will help you control your blood glucose level. It can also help slow or prevent worsening of your diabetes mellitus. Healthy eating may even help you improve the level of your blood pressure and reach or maintain a healthy weight. General recommendations for healthful eating and cooking habits include:  Eating meals and snacks regularly. Avoid going long periods of time without eating to lose weight.  Eating a diet that consists mainly of plant-based foods, such as fruits, vegetables, nuts, legumes, and whole grains.  Using low-heat cooking methods, such as baking, instead of high-heat cooking methods, such as deep frying.  Work with your dietitian to make sure you understand how to use the Nutrition Facts information on food labels. How can food affect me? Carbohydrates Carbohydrates affect your blood glucose level more than any other type of food. Your dietitian will help  you determine how many carbohydrates to eat at each meal and teach you how to count carbohydrates. Counting carbohydrates is important to keep your blood glucose at a healthy level, especially if you are using insulin or taking certain medicines for diabetes mellitus. Alcohol Alcohol can cause sudden decreases in blood glucose (hypoglycemia), especially if you use insulin or take certain medicines for diabetes mellitus. Hypoglycemia can be a life-threatening condition. Symptoms of hypoglycemia (sleepiness, dizziness, and disorientation) are similar to symptoms of having too much alcohol. If your health care provider has given you approval to drink alcohol, do so in moderation and use the following guidelines:  Women should not have more than one drink per day, and men should not have more than two drinks per day. One drink is equal to: ? 12 oz of beer. ? 5 oz of wine. ? 1 oz of hard liquor.  Do not drink on an empty stomach.  Keep yourself hydrated. Have water, diet soda, or unsweetened iced tea.  Regular soda, juice, and other mixers might contain a lot of carbohydrates and should be counted.  What foods are not recommended? As you make food choices, it is important to remember that all foods are not the same. Some foods have fewer nutrients per serving than other foods, even though they might have the same number of calories or carbohydrates. It is difficult to get your body what it needs when you eat foods with fewer nutrients. Examples of foods that you should avoid that are high in calories and carbohydrates but low in nutrients include:  Trans fats (most processed   foods list trans fats on the Nutrition Facts label).  Regular soda.  Juice.  Candy.  Sweets, such as cake, pie, doughnuts, and cookies.  Fried foods.  What foods can I eat? Eat nutrient-rich foods, which will nourish your body and keep you healthy. The food you should eat also will depend on several factors,  including:  The calories you need.  The medicines you take.  Your weight.  Your blood glucose level.  Your blood pressure level.  Your cholesterol level.  You should eat a variety of foods, including:  Protein. ? Lean cuts of meat. ? Proteins low in saturated fats, such as fish, egg whites, and beans. Avoid processed meats.  Fruits and vegetables. ? Fruits and vegetables that may help control blood glucose levels, such as apples, mangoes, and yams.  Dairy products. ? Choose fat-free or low-fat dairy products, such as milk, yogurt, and cheese.  Grains, bread, pasta, and rice. ? Choose whole grain products, such as multigrain bread, whole oats, and brown rice. These foods may help control blood pressure.  Fats. ? Foods containing healthful fats, such as nuts, avocado, olive oil, canola oil, and fish.  Does everyone with diabetes mellitus have the same meal plan? Because every person with diabetes mellitus is different, there is not one meal plan that works for everyone. It is very important that you meet with a dietitian who will help you create a meal plan that is just right for you. This information is not intended to replace advice given to you by your health care provider. Make sure you discuss any questions you have with your health care provider. Document Released: 11/16/2004 Document Revised: 07/28/2015 Document Reviewed: 01/16/2013 Elsevier Interactive Patient Education  2017 Elsevier Inc.  

## 2017-01-07 ENCOUNTER — Ambulatory Visit: Payer: 59 | Admitting: Family Medicine

## 2017-01-21 ENCOUNTER — Ambulatory Visit: Payer: 59 | Admitting: Family Medicine

## 2017-01-21 ENCOUNTER — Encounter: Payer: Self-pay | Admitting: Family Medicine

## 2017-01-21 VITALS — BP 121/76 | HR 74 | Temp 98.5°F | Resp 18 | Ht 61.5 in | Wt 129.2 lb

## 2017-01-21 DIAGNOSIS — Z23 Encounter for immunization: Secondary | ICD-10-CM

## 2017-01-21 DIAGNOSIS — E1165 Type 2 diabetes mellitus with hyperglycemia: Secondary | ICD-10-CM

## 2017-01-21 DIAGNOSIS — I10 Essential (primary) hypertension: Secondary | ICD-10-CM

## 2017-01-21 LAB — POCT GLYCOSYLATED HEMOGLOBIN (HGB A1C): Hemoglobin A1C: >14

## 2017-01-21 MED ORDER — INSULIN GLARGINE 100 UNIT/ML ~~LOC~~ SOLN: 10.0000 [IU] | Freq: Every day | SUBCUTANEOUS | 3 refills | Status: DC

## 2017-01-21 MED ORDER — PEN NEEDLES 32G X 4 MM MISC: 1.0000 "application " | Freq: Every day | 3 refills | Status: DC

## 2017-01-21 NOTE — Progress Notes (Signed)
Chief Complaint  Patient presents with  . A1C    here for an f/u on her A1C  . Follow-up    HPI  Diabetes Mellitus: Patient presents for follow up of diabetes. Symptoms: hyperglycemia, polydipsia and polyuria. Symptoms have gradually improved. Patient denies foot ulcerations, hypoglycemia , nausea and paresthesia of the feet.  Evaluation to date has been included: hemoglobin A1C.  Home sugars: patient does not check sugars. Treatment to date: Continued sulfonylurea which has been ineffective and Continued Actos which has been ineffective.  Lab Results  Component Value Date   HGBA1C >14.0 01/21/2017   Lab Results  Component Value Date   HGBA1C >14.0 01/21/2017   She reports that sometimes she does not eat breakfast or dinner She has been eating a lot of candy and sweets  Hypertension: Patient here for follow-up of elevated blood pressure. She is not exercising and is not adherent to low salt diet.  Blood pressure is not checked at home.  Cardiac symptoms none. Patient denies chest pain, chest pressure/discomfort, claudication, dyspnea, fatigue, irregular heart beat and lower extremity edema.  Cardiovascular risk factors: diabetes mellitus, dyslipidemia, hypertension and sedentary lifestyle. Use of agents associated with hypertension: none. History of target organ damage: none. BP Readings from Last 3 Encounters:  01/21/17 121/76  12/03/16 110/66  11/19/16 132/82     Past Medical History:  Diagnosis Date  . Diabetes mellitus without complication (HCC)   . High cholesterol   . Hypertension     Current Outpatient Medications  Medication Sig Dispense Refill  . aspirin EC 81 MG tablet Take 81 mg by mouth daily.    Marland Kitchen. glipiZIDE (GLUCOTROL XL) 10 MG 24 hr tablet Take 1 tablet (10 mg total) by mouth 2 (two) times daily. 60 tablet 1  . lisinopril (PRINIVIL,ZESTRIL) 20 MG tablet Take 1 tablet (20 mg total) by mouth daily. 30 tablet 1  . lithium carbonate (ESKALITH) 450 MG CR tablet  Take 1 tablet (450 mg total) by mouth 2 (two) times daily. 60 tablet 1  . pioglitazone (ACTOS) 30 MG tablet Take 1 tablet (30 mg total) by mouth daily. 30 tablet 1  . pravastatin (PRAVACHOL) 40 MG tablet Take 1 tablet (40 mg total) by mouth daily. 30 tablet 1  . insulin glargine (LANTUS) 100 UNIT/ML injection Inject 0.1 mLs (10 Units total) at bedtime into the skin. E11.65 10 mL 3  . Insulin Pen Needle (PEN NEEDLES) 32G X 4 MM MISC 1 application at bedtime by Does not apply route. E11.65 100 each 3   No current facility-administered medications for this visit.     Allergies: No Known Allergies  No past surgical history on file.  Social History   Socioeconomic History  . Marital status: Married    Spouse name: None  . Number of children: None  . Years of education: None  . Highest education level: None  Social Needs  . Financial resource strain: None  . Food insecurity - worry: None  . Food insecurity - inability: None  . Transportation needs - medical: None  . Transportation needs - non-medical: None  Occupational History  . None  Tobacco Use  . Smoking status: Former Smoker    Types: Cigarettes  . Smokeless tobacco: Never Used  Substance and Sexual Activity  . Alcohol use: No  . Drug use: No  . Sexual activity: No  Other Topics Concern  . None  Social History Narrative  . None    Family History  Problem Relation  Age of Onset  . Hypertension Mother   . Heart attack Sister   . Cancer - Cervical Sister      ROS Review of Systems See HPI Constitution: No fevers or chills No malaise No diaphoresis Skin: No rash or itching Eyes: no blurry vision, no double vision GU: no dysuria or hematuria Neuro: no dizziness or headaches  Objective: Vitals:   01/21/17 0935  BP: 121/76  Pulse: 74  Resp: 18  Temp: 98.5 F (36.9 C)  TempSrc: Oral  SpO2: 100%  Weight: 129 lb 3.2 oz (58.6 kg)  Height: 5' 1.5" (1.562 m)    Physical Exam Physical Exam    Constitutional: She is oriented to person, place, and time. She appears well-developed and well-nourished.  HENT:  Head: Normocephalic and atraumatic.  Eyes: Conjunctivae and EOM are normal.  Cardiovascular: Normal rate, regular rhythm and normal heart sounds.   Pulmonary/Chest: Effort normal and breath sounds normal. No respiratory distress. She has no wheezes.  Abdominal: Normal appearance and bowel sounds are normal. There is no tenderness. There is no CVA tenderness.  Neurological: She is alert and oriented to person, place, and time.    Assessment and Plan Amber Patterson was seen today for a1c and follow-up.  Diagnoses and all orders for this visit:  Uncontrolled type 2 diabetes mellitus with hyperglycemia (HCC)- counseling pt about insulin Showed video on how to use pens Discussed that since her a1c remains about 14% Based on her age and the complications of uncontrolled diabetes would like to start insulin and refer to Endocrinology She is fearful of having to inject herself.  -     POCT glycosylated hemoglobin (Hb A1C) -     Ambulatory referral to Endocrinology -   Referred to Diabetic Education Pt prefers to return in one week   Essential hypertension- bp at goal Discussed continue bp mgmt  Need for influenza vaccination -     Flu Vaccine QUAD 6+ mos PF IM (Fluarix Quad PF)  Other orders -     insulin glargine (LANTUS) 100 UNIT/ML injection; Inject 0.1 mLs (10 Units total) at bedtime into the skin. E11.65 -     Insulin Pen Needle (PEN NEEDLES) 32G X 4 MM MISC; 1 application at bedtime by Does not apply route. E11.65   A total of 30 minutes were spent face-to-face with the patient during this encounter and over half of that time was spent on counseling and coordination of care.   Adrian Dinovo A Gianpaolo Mindel

## 2017-01-21 NOTE — Patient Instructions (Addendum)
Diabetes Mellitus and Food It is important for you to manage your blood sugar (glucose) level. Your blood glucose level can be greatly affected by what you eat. Eating healthier foods in the appropriate amounts throughout the day at about the same time each day will help you control your blood glucose level. It can also help slow or prevent worsening of your diabetes mellitus. Healthy eating may even help you improve the level of your blood pressure and reach or maintain a healthy weight. General recommendations for healthful eating and cooking habits include:  Eating meals and snacks regularly. Avoid going long periods of time without eating to lose weight.  Eating a diet that consists mainly of plant-based foods, such as fruits, vegetables, nuts, legumes, and whole grains.  Using low-heat cooking methods, such as baking, instead of high-heat cooking methods, such as deep frying.  Work with your dietitian to make sure you understand how to use the Nutrition Facts information on food labels. How can food affect me? Carbohydrates Carbohydrates affect your blood glucose level more than any other type of food. Your dietitian will help you determine how many carbohydrates to eat at each meal and teach you how to count carbohydrates. Counting carbohydrates is important to keep your blood glucose at a healthy level, especially if you are using insulin or taking certain medicines for diabetes mellitus. Alcohol Alcohol can cause sudden decreases in blood glucose (hypoglycemia), especially if you use insulin or take certain medicines for diabetes mellitus. Hypoglycemia can be a life-threatening condition. Symptoms of hypoglycemia (sleepiness, dizziness, and disorientation) are similar to symptoms of having too much alcohol. If your health care provider has given you approval to drink alcohol, do so in moderation and use the following guidelines:  Women should not have more than one drink per day, and men  should not have more than two drinks per day. One drink is equal to: ? 12 oz of beer. ? 5 oz of wine. ? 1 oz of hard liquor.  Do not drink on an empty stomach.  Keep yourself hydrated. Have water, diet soda, or unsweetened iced tea.  Regular soda, juice, and other mixers might contain a lot of carbohydrates and should be counted.  What foods are not recommended? As you make food choices, it is important to remember that all foods are not the same. Some foods have fewer nutrients per serving than other foods, even though they might have the same number of calories or carbohydrates. It is difficult to get your body what it needs when you eat foods with fewer nutrients. Examples of foods that you should avoid that are high in calories and carbohydrates but low in nutrients include:  Trans fats (most processed foods list trans fats on the Nutrition Facts label).  Regular soda.  Juice.  Candy.  Sweets, such as cake, pie, doughnuts, and cookies.  Fried foods.  What foods can I eat? Eat nutrient-rich foods, which will nourish your body and keep you healthy. The food you should eat also will depend on several factors, including:  The calories you need.  The medicines you take.  Your weight.  Your blood glucose level.  Your blood pressure level.  Your cholesterol level.  You should eat a variety of foods, including:  Protein. ? Lean cuts of meat. ? Proteins low in saturated fats, such as fish, egg whites, and beans. Avoid processed meats.  Fruits and vegetables. ? Fruits and vegetables that may help control blood glucose levels, such as apples,   mangoes, and yams.  Dairy products. ? Choose fat-free or low-fat dairy products, such as milk, yogurt, and cheese.  Grains, bread, pasta, and rice. ? Choose whole grain products, such as multigrain bread, whole oats, and brown rice. These foods may help control blood pressure.  Fats. ? Foods containing healthful fats, such as  nuts, avocado, olive oil, canola oil, and fish.  Does everyone with diabetes mellitus have the same meal plan? Because every person with diabetes mellitus is different, there is not one meal plan that works for everyone. It is very important that you meet with a dietitian who will help you create a meal plan that is just right for you. This information is not intended to replace advice given to you by your health care provider. Make sure you discuss any questions you have with your health care provider. Document Released: 11/16/2004 Document Revised: 07/28/2015 Document Reviewed: 01/16/2013 Elsevier Interactive Patient Education  2017 Elsevier Inc.    IF you received an x-ray today, you will receive an invoice from [facility]. Please contact [facility] at [phone] with questions or concerns regarding your invoice.   IF you received labwork today, you will receive an invoice from [facility]. Please contact [facility] at [phone] with questions or concerns regarding your invoice.   Our billing staff will not be able to assist you with questions regarding bills from these companies.  You will be contacted with the lab results as soon as they are available. The fastest way to get your results is to activate your My Chart account. Instructions are located on the last page of this paperwork. If you have not heard from us regarding the results in 2 weeks, please contact this office.      

## 2017-01-25 NOTE — Progress Notes (Deleted)
  No chief complaint on file.   HPI  4 review of systems  Past Medical History:  Diagnosis Date  . Diabetes mellitus without complication (HCC)   . High cholesterol   . Hypertension     Current Outpatient Medications  Medication Sig Dispense Refill  . aspirin EC 81 MG tablet Take 81 mg by mouth daily.    Marland Kitchen. glipiZIDE (GLUCOTROL XL) 10 MG 24 hr tablet Take 1 tablet (10 mg total) by mouth 2 (two) times daily. 60 tablet 1  . insulin glargine (LANTUS) 100 UNIT/ML injection Inject 0.1 mLs (10 Units total) at bedtime into the skin. E11.65 10 mL 3  . Insulin Pen Needle (PEN NEEDLES) 32G X 4 MM MISC 1 application at bedtime by Does not apply route. E11.65 100 each 3  . lisinopril (PRINIVIL,ZESTRIL) 20 MG tablet Take 1 tablet (20 mg total) by mouth daily. 30 tablet 1  . lithium carbonate (ESKALITH) 450 MG CR tablet Take 1 tablet (450 mg total) by mouth 2 (two) times daily. 60 tablet 1  . pioglitazone (ACTOS) 30 MG tablet Take 1 tablet (30 mg total) by mouth daily. 30 tablet 1  . pravastatin (PRAVACHOL) 40 MG tablet Take 1 tablet (40 mg total) by mouth daily. 30 tablet 1   No current facility-administered medications for this visit.     Allergies: No Known Allergies  No past surgical history on file.  Social History   Socioeconomic History  . Marital status: Married    Spouse name: Not on file  . Number of children: Not on file  . Years of education: Not on file  . Highest education level: Not on file  Social Needs  . Financial resource strain: Not on file  . Food insecurity - worry: Not on file  . Food insecurity - inability: Not on file  . Transportation needs - medical: Not on file  . Transportation needs - non-medical: Not on file  Occupational History  . Not on file  Tobacco Use  . Smoking status: Former Smoker    Types: Cigarettes  . Smokeless tobacco: Never Used  Substance and Sexual Activity  . Alcohol use: No  . Drug use: No  . Sexual activity: No  Other Topics  Concern  . Not on file  Social History Narrative  . Not on file    Family History  Problem Relation Age of Onset  . Hypertension Mother   . Heart attack Sister   . Cancer - Cervical Sister      ROS Review of Systems See HPI Constitution: No fevers or chills No malaise No diaphoresis Skin: No rash or itching Eyes: no blurry vision, no double vision GU: no dysuria or hematuria Neuro: no dizziness or headaches * all others reviewed and negative   Objective: There were no vitals filed for this visit.  Physical Exam  Assessment and Plan There are no diagnoses linked to this encounter.   Kaylanie Capili P [employer]

## 2017-01-28 ENCOUNTER — Telehealth: Payer: Self-pay

## 2017-01-28 ENCOUNTER — Ambulatory Visit: Payer: 59 | Admitting: Family Medicine

## 2017-01-28 MED ORDER — INSULIN DETEMIR 100 UNIT/ML ~~LOC~~ SOLN: 10.0000 [IU] | Freq: Every day | SUBCUTANEOUS | 6 refills | Status: DC

## 2017-01-28 NOTE — Telephone Encounter (Signed)
Called patient to advise Lantus has been changed to Levemir and she stated that she does not stick herself.  I inquired if she has been taking the Lantus and she replied, "I have never and will never take a medication where I have to stick myself."  I advised her of the importance of taking her medication as directed and told her that we would be happy to educate her on how to administer the medication, if needed.  She declined. Note sent to Dr. Creta LevinStallings to make her aware of patient's reaction.

## 2017-01-28 NOTE — Telephone Encounter (Signed)
Please let the patient know that I changed her medication to Levemir

## 2017-01-28 NOTE — Telephone Encounter (Signed)
Dr. Creta LevinStallings, Called insurance company to get PA.  They rejected the PA saying plan does not cover Lantus.  They said Tier 1 choice for plan is either Basaglar or levemir, and request provider change medication to one of these.   Please make appropriate changes and send to plan. Thank you, Emalea Mix

## 2017-01-28 NOTE — Addendum Note (Signed)
Addended by: Collie SiadSTALLINGS, Ameriah Lint A on: 01/28/2017 01:47 PM   Modules accepted: Orders

## 2017-01-31 ENCOUNTER — Other Ambulatory Visit: Payer: Self-pay | Admitting: Family Medicine

## 2017-02-11 ENCOUNTER — Ambulatory Visit: Payer: Self-pay | Admitting: Dietician

## 2017-02-11 ENCOUNTER — Ambulatory Visit: Payer: 59 | Admitting: Family Medicine

## 2017-02-15 NOTE — Telephone Encounter (Signed)
Patient has been referred to Endocrinology

## 2017-02-18 ENCOUNTER — Ambulatory Visit: Payer: 59 | Admitting: Family Medicine

## 2017-02-18 ENCOUNTER — Encounter: Payer: Self-pay | Admitting: Family Medicine

## 2017-02-18 ENCOUNTER — Other Ambulatory Visit: Payer: Self-pay

## 2017-02-18 VITALS — BP 128/76 | HR 74 | Temp 98.9°F | Resp 17 | Ht 61.5 in | Wt 128.4 lb

## 2017-02-18 DIAGNOSIS — I10 Essential (primary) hypertension: Secondary | ICD-10-CM | POA: Diagnosis not present

## 2017-02-18 DIAGNOSIS — E1165 Type 2 diabetes mellitus with hyperglycemia: Secondary | ICD-10-CM | POA: Diagnosis not present

## 2017-02-18 DIAGNOSIS — Z5181 Encounter for therapeutic drug level monitoring: Secondary | ICD-10-CM

## 2017-02-18 LAB — POCT GLYCOSYLATED HEMOGLOBIN (HGB A1C)

## 2017-02-18 MED ORDER — PIOGLITAZONE HCL-METFORMIN HCL 15-500 MG PO TABS: 1.0000 | ORAL_TABLET | Freq: Two times a day (BID) | ORAL | 1 refills | Status: DC

## 2017-02-18 MED ORDER — PIOGLITAZONE HCL-METFORMIN ER 30-1000 MG PO TB24: 1.0000 | ORAL_TABLET | Freq: Every day | ORAL | 1 refills | Status: DC
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
## 2017-02-18 NOTE — Patient Instructions (Addendum)
Continue  Your Glipizide XL Your new medication PIOGLITAZONE-METFORMIN 15-55m should be started today. This is a twice a day medication. Eat breakfast, lunch and dinner with 2 snacks    IF you received an x-ray today, you will receive an invoice from [facility]. Please contact [facility] at [phone] questions or concerns regarding your invoice.   IF you received labwork today, you will receive an invoice from [facility] Please contact [facility] at [phone] questions or concerns regarding your invoice.   Our billing staff will not be able to assist you with questions regarding bills from these companies.  You will be contacted with the lab results as soon as they are available. The fastest way to get your results is to activate your My Chart account. Instructions are located on the last page of this paperwork. If you have not heard from [facility] the results in 2 weeks, please contact this office.    Metformin; Pioglitazone extended release tablets What is this medicine? METFORMIN; PIOGLITAZONE (met FOR min; pye oh GLI ta zone) helps to treat type 2 diabetes. It helps to control blood sugar. Treatment is combined with diet and exercise. This medicine may be used for other purposes; ask your health care provider or pharmacist if you have questions. COMMON BRAND NAME(S): Actoplus Met XR What should I tell my health care provider before I take this medicine? They need to know if you have any of these conditions: -bladder cancer -become easily dehydrated -diabetic ketoacidosis -heart disease -if you frequently drink alcohol containing drinks -kidney disease -liver disease -polycystic ovary syndrome -serious infection or injury -swelling of the arms, legs, or feet -undergoing surgery or certain x-ray procedures with injectable contrast agents -vomiting -an unusual or allergic reaction to metformin, pioglitazone, medicines, foods, dyes, or  preservatives -pregnant or trying to get pregnant -breast-feeding How should I use this medicine? Take this medicine by mouth with a glass of water. Follow the directions on the prescription label. Take this medicine with food. Swallow whole, do not crush or chew. Take your doses at regular intervals. Do not take your medicine more often than directed. Do not stop taking except on your doctor's advice. A special MedGuide will be given to you by the pharmacist with each prescription and refill. Be sure to read this information carefully each time. Talk to your pediatrician regarding the use of this medicine in children. Special care may be needed. Overdosage: If you think you have taken too much of this medicine contact a poison control center or emergency room at once. NOTE: This medicine is only for you. Do not share this medicine with others. What if I miss a dose? If you miss a dose, take it as soon as you can. If it is almost time for your next dose, take only that dose. Do not take double or extra doses.  What should I watch for while using this medicine? Visit your doctor or health care professional for regular checks on your progress. You may need regular tests to make sure your liver is working properly. A test called the HbA1C (A1C) will be monitored. This is a simple blood test. It measures your blood sugar control over the last 2 to 3 months. You will receive this test every 3 to 6 months. Learn how to check your blood sugar. Learn the symptoms of low and high blood sugar and how to manage them. Always carry a quick-source of sugar with you in case you have symptoms of  low blood sugar. Examples include hard sugar candy or glucose tablets. Make sure others know that you can choke if you eat or drink when you develop serious symptoms of low blood sugar, such as seizures or unconsciousness. They must get medical help at once. Tell your doctor or health care professional if you have high  blood sugar. You might need to change the dose of your medicine. If you are sick or exercising more than usual, you might need to change the dose of your medicine. Do not skip meals. Ask your doctor or health care professional if you should avoid alcohol. Many nonprescription cough and cold products contain sugar or alcohol. These can affect blood sugar. This medicine may increase your risk of having certain heart problems. Get medical help right away if you have any chest pain or tightness, or pain that radiates to the jaw or down the arm, and shortness of breath. These may be signs of a serious medical condition. The tablet shell for some brands of this medicine does not dissolve. This is normal. The tablet shell may appear whole in the stool. This is not a cause for concern. This medicine may cause ovulation in premenopausal women who do not have regular monthly periods. This may increase your chances of becoming pregnant. You should not take this medicine if you become pregnant or think you may be pregnant. Talk with your doctor or health care professional about your birth control options while taking this medicine. Contact your doctor or health care professional right away if think you are pregnant. If you are going to need surgery, a MRI, CT scan, or other procedure, tell your doctor that you are taking this medicine. You may need to stop taking this medicine before the procedure. Wear a medical ID bracelet or chain, and carry a card that describes your disease and details of your medicine and dosage times. What side effects may I notice from receiving this medicine? Side effects that you should report to your doctor or health care professional as soon as possible: -allergic reactions like skin rash, itching or hives, swelling of the face, lips, or tongue -blood in the urine -breathing problems -feeling faint or lightheaded, falls -fever, chills -increased urination -muscle aches or pains -pain  when urinating -signs and symptoms of low blood sugar such as feeling anxious, confusion, dizziness, increased hunger, unusually weak or tired, sweating, shakiness, cold, irritable, headache, blurred vision, fast heartbeat, loss of consciousness -slow or irregular heartbeat -unusual stomach pain or discomfort Side effects that usually do not require medical attention (report to your doctor or health care professional if they continue or are bothersome): -diarrhea -headache -heartburn -metallic taste in mouth -nausea -sore throat -stuffy or runny nose -stomach gas, upset This list may not describe all possible side effects. Call your doctor for medical advice about side effects. You may report side effects to FDA at 1-800-FDA-1088. Where should I keep my medicine? Keep out of the reach of children. Store at room temperature between 15 and 30 degrees C (59 and 86 degrees F). Protect from light. Throw away any unused medicine after the expiration date. NOTE: This sheet is a summary. It may not cover all possible information. If you have questions about this medicine, talk to your doctor, pharmacist, or health care provider.  2018 Elsevier/Gold Standard (2015-03-23 18:28:17)

## 2017-03-04 ENCOUNTER — Ambulatory Visit: Payer: Self-pay | Admitting: Registered"

## 2017-03-25 ENCOUNTER — Ambulatory Visit: Payer: 59 | Admitting: Family Medicine

## 2017-04-01 ENCOUNTER — Other Ambulatory Visit: Payer: Self-pay

## 2017-04-01 ENCOUNTER — Ambulatory Visit: Payer: 59 | Admitting: Family Medicine

## 2017-04-01 ENCOUNTER — Encounter: Payer: Self-pay | Admitting: Family Medicine

## 2017-04-01 VITALS — BP 130/78 | HR 83 | Temp 97.6°F | Resp 16 | Ht 61.5 in | Wt 128.6 lb

## 2017-04-01 DIAGNOSIS — I10 Essential (primary) hypertension: Secondary | ICD-10-CM

## 2017-04-01 DIAGNOSIS — R739 Hyperglycemia, unspecified: Secondary | ICD-10-CM | POA: Diagnosis not present

## 2017-04-01 DIAGNOSIS — Z713 Dietary counseling and surveillance: Secondary | ICD-10-CM | POA: Diagnosis not present

## 2017-04-01 DIAGNOSIS — E1165 Type 2 diabetes mellitus with hyperglycemia: Secondary | ICD-10-CM

## 2017-04-01 LAB — GLUCOSE, POCT (MANUAL RESULT ENTRY): POC Glucose: 243 mg/dl — AB (ref 70–99)

## 2017-04-01 MED ORDER — LANCETS 30G MISC: 11 refills | Status: DC
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
## 2017-04-01 NOTE — Patient Instructions (Addendum)
   IF you received an x-ray today, you will receive an invoice from [facility]. Please contact [facility] at [phone] with questions or concerns regarding your invoice.   IF you received labwork today, you will receive an invoice from [facility]. Please contact [facility] at [phone] with questions or concerns regarding your invoice.   Our billing staff will not be able to assist you with questions regarding bills from these companies.  You will be contacted with the lab results as soon as they are available. The fastest way to get your results is to activate your My Chart account. Instructions are located on the last page of this paperwork. If you have not heard from us regarding the results in 2 weeks, please contact this office.      Carbohydrate Counting for Diabetes Mellitus, Adult Carbohydrate counting is a method for keeping track of how many carbohydrates you eat. Eating carbohydrates naturally increases the amount of sugar (glucose) in the blood. Counting how many carbohydrates you eat helps keep your blood glucose within normal limits, which helps you manage your diabetes (diabetes mellitus). It is important to know how many carbohydrates you can safely have in each meal. This is different for every person. A diet and nutrition specialist (registered dietitian) can help you make a meal plan and calculate how many carbohydrates you should have at each meal and snack. Carbohydrates are found in the following foods:  Grains, such as breads and cereals.  Dried beans and soy products.  Starchy vegetables, such as potatoes, peas, and corn.  Fruit and fruit juices.  Milk and yogurt.  Sweets and snack foods, such as cake, cookies, candy, chips, and soft drinks.  How do I count carbohydrates? There are two ways to count carbohydrates in food. You can use either of the methods or a combination of both. Reading "Nutrition Facts" on packaged food The  "Nutrition Facts" list is included on the labels of almost all packaged foods and beverages in the [place] It includes:  The serving size.  Information about nutrients in each serving, including the grams (g) of carbohydrate per serving.  To use the "Nutrition Facts":  Decide how many servings you will have.  Multiply the number of servings by the number of carbohydrates per serving.  The resulting number is the total amount of carbohydrates that you will be having.  Learning standard serving sizes of other foods When you eat foods containing carbohydrates that are not packaged or do not include "Nutrition Facts" on the label, you need to measure the servings in order to count the amount of carbohydrates:  Measure the foods that you will eat with a food scale or measuring cup, if needed.  Decide how many standard-size servings you will eat.  Multiply the number of servings by 15. Most carbohydrate-rich foods have about 15 g of carbohydrates per serving. ? For example, if you eat 8 oz (170 g) of strawberries, you will have eaten 2 servings and 30 g of carbohydrates (2 servings x 15 g = 30 g).  For foods that have more than one food mixed, such as soups and casseroles, you must count the carbohydrates in each food that is included.  The following list contains standard serving sizes of common carbohydrate-rich foods. Each of these servings has about 15 g of carbohydrates:   hamburger bun or  English muffin.   oz (15 mL) syrup.   oz (14 g) jelly.  1 slice of bread.  1 six-inch tortilla.    3 oz (85 g) cooked rice or pasta.  4 oz (113 g) cooked dried beans.  4 oz (113 g) starchy vegetable, such as peas, corn, or potatoes.  4 oz (113 g) hot cereal.  4 oz (113 g) mashed potatoes or  of a large baked potato.  4 oz (113 g) canned or frozen fruit.  4 oz (120 mL) fruit juice.  4-6 crackers.  6 chicken nuggets.  6 oz (170 g) unsweetened dry cereal.  6 oz (170 g) plain  fat-free yogurt or yogurt sweetened with artificial sweeteners.  8 oz (240 mL) milk.  8 oz (170 g) fresh fruit or one small piece of fruit.  24 oz (680 g) popped popcorn.  Example of carbohydrate counting Sample meal  3 oz (85 g) chicken breast.  6 oz (170 g) brown rice.  4 oz (113 g) corn.  8 oz (240 mL) milk.  8 oz (170 g) strawberries with sugar-free whipped topping. Carbohydrate calculation 1. Identify the foods that contain carbohydrates: ? Rice. ? Corn. ? Milk. ? Strawberries. 2. Calculate how many servings you have of each food: ? 2 servings rice. ? 1 serving corn. ? 1 serving milk. ? 1 serving strawberries. 3. Multiply each number of servings by 15 g: ? 2 servings rice x 15 g = 30 g. ? 1 serving corn x 15 g = 15 g. ? 1 serving milk x 15 g = 15 g. ? 1 serving strawberries x 15 g = 15 g. 4. Add together all of the amounts to find the total grams of carbohydrates eaten: ? 30 g + 15 g + 15 g + 15 g = 75 g of carbohydrates total. This information is not intended to replace advice given to you by your health care provider. Make sure you discuss any questions you have with your health care provider. Document Released: 02/19/2005 Document Revised: 09/09/2015 Document Reviewed: 08/03/2015 Elsevier Interactive Patient Education  2018 Elsevier Inc.  

## 2017-05-06 ENCOUNTER — Other Ambulatory Visit: Payer: Self-pay | Admitting: Family Medicine

## 2017-05-06 ENCOUNTER — Other Ambulatory Visit: Payer: Self-pay

## 2017-05-06 ENCOUNTER — Ambulatory Visit: Payer: 59 | Admitting: Family Medicine

## 2017-05-06 ENCOUNTER — Encounter: Payer: Self-pay | Admitting: Family Medicine

## 2017-05-06 VITALS — BP 122/64 | HR 64 | Temp 98.5°F | Resp 16 | Ht 61.75 in | Wt 129.4 lb

## 2017-05-06 DIAGNOSIS — Z5181 Encounter for therapeutic drug level monitoring: Secondary | ICD-10-CM

## 2017-05-06 DIAGNOSIS — E119 Type 2 diabetes mellitus without complications: Secondary | ICD-10-CM | POA: Diagnosis not present

## 2017-05-06 LAB — POCT GLYCOSYLATED HEMOGLOBIN (HGB A1C): HEMOGLOBIN A1C: 10.5

## 2017-05-06 MED ORDER — METFORMIN HCL 500 MG PO TABS: 500.0000 mg | ORAL_TABLET | Freq: Every day | ORAL | 0 refills | Status: DC

## 2017-05-06 NOTE — Progress Notes (Signed)
Chief Complaint  Patient presents with  . Diabetes    FOLLOW UP - 4 weeks   . Medication Refill    glipizide XL,  pravachol, lisinopril and lithium    HPI  Pt reports that she has been doing well with diabetes  Diabetes Mellitus: Patient presents for follow up of diabetes. Symptoms: none. Symptoms have stabilized. Patient denies nausea, polydipsia, polyuria, visual disturbances and vomitting.  Evaluation to date has been included: hemoglobin A1C.  Home sugars: patient does not check sugars.  Wt Readings from Last 3 Encounters:  05/06/17 129 lb 6.4 oz (58.7 kg)  04/01/17 128 lb 9.6 oz (58.3 kg)  02/18/17 128 lb 6.4 oz (58.2 kg)   She states that this morning she had toast with sugar free jelly, 2 hard boiled eggs and a piece of chicken with two glasses of water.  She has cut out soda and is drinking sugar free green tea She states that she is trying to teach herself that she can drink nonsugary beverages  Pt currently on pioglitazone-metformin 15-521m breakfast and dinner Glipizide XL 132m24 hr tablet  Past Medical History:  Diagnosis Date  . [place] without [place]  . High cholesterol   . Hypertension     Current Outpatient Medications  Medication Sig Dispense Refill  . aspirin EC 81 MG tablet Take 81 mg by mouth daily.    . Marland KitchenLIPIZIDE XL 10 MG 24 hr tablet TAKE 1 TABLET BY MOUTH TWICE DAILY 60 tablet 1  . lisinopril (PRINIVIL,ZESTRIL) 20 MG tablet TAKE 1 TABLET BY MOUTH ONCE DAILY 30 tablet 1  . lithium carbonate (ESKALITH) 450 MG CR tablet TAKE 1 TABLET BY MOUTH TWICE DAILY 60 tablet 1  . pioglitazone-metformin (ACTOPLUS MET) 15-500 MG tablet Take 1 tablet by mouth 2 (two) times daily with a meal. 60 tablet 1  . pravastatin (PRAVACHOL) 40 MG tablet TAKE 1 TABLET BY MOUTH ONCE DAILY 30 tablet 1  . Insulin Pen Needle (PEN NEEDLES) 32G X 4 MM MISC 1 application at bedtime by Does not apply route. E11.65 (Patient not taking: Reported on 05/06/2017) 100  each 3  . Lancets 30G MISC Use to check blood glucose twice a day. E11.65 (Patient not taking: Reported on 05/06/2017) 100 each 11  . metFORMIN (GLUCOPHAGE) 500 MG tablet Take 1 tablet (500 mg total) by mouth daily with lunch. 90 tablet 0   No current facility-administered medications for this visit.     Allergies: No Known Allergies  No past surgical history on file.  Social History   Socioeconomic History  . Marital status: Married    Spouse name: None  . Number of children: None  . Years of education: None  . Highest education level: None  Social Needs  . Financial resource strain: None  . Food insecurity - worry: None  . Food insecurity - inability: None  . Transportation needs - medical: None  . Transportation needs - non-medical: None  Occupational History  . None  Tobacco Use  . Smoking status: Former Smoker    Types: Cigarettes  . Smokeless tobacco: Never Used  Substance and Sexual Activity  . Alcohol use: No  . Drug use: No  . Sexual activity: No  Other Topics Concern  . None  Social History Narrative  . None    Family History  Problem Relation Age of Onset  . Hypertension Mother   . Heart attack Sister   . Cancer - Cervical Sister  ROS Review of Systems See HPI Constitution: No fevers or chills No malaise No diaphoresis Skin: No rash or itching Eyes: no blurry vision, no double vision GU: no dysuria or hematuria Neuro: no dizziness or headaches all others reviewed and negative   Objective: Vitals:   05/06/17 1020  BP: 122/64  Pulse: 64  Resp: 16  Temp: 98.5 F (36.9 C)  TempSrc: Oral  SpO2: 98%  Weight: 129 lb 6.4 oz (58.7 kg)  Height: 5' 1.75" (1.568 m)    Physical Exam  Constitutional: She is oriented to person, place, and time. She appears well-developed and well-nourished.  HENT:  Head: Normocephalic and atraumatic.  Right Ear: External ear normal.  Left Ear: External ear normal.  Mouth/Throat: Oropharynx is clear and  moist. No oropharyngeal exudate.  Eyes: Conjunctivae and EOM are normal.  Neck: Normal range of motion. No thyromegaly present.  Cardiovascular: Normal rate, regular rhythm and normal heart sounds.  No murmur heard. Pulmonary/Chest: Effort normal and breath sounds normal. No stridor. No respiratory distress.  Abdominal: Soft. Bowel sounds are normal. She exhibits no distension and no mass. There is no tenderness. There is no guarding.  Neurological: She is alert and oriented to person, place, and time.  Skin: Skin is warm. Capillary refill takes less than 2 seconds.  Psychiatric: She has a normal mood and affect. Her behavior is normal. Judgment and thought content normal.    Assessment and Plan Gaynor was seen today for diabetes and medication refill.  Diagnoses and all orders for this visit:  Type 2 diabetes mellitus without complication, without long-term current use of insulin (HCC) -     POCT glycosylated hemoglobin (Hb A1C)  Encounter for medication monitoring -     POCT glycosylated hemoglobin (Hb A1C)  Other orders -     metFORMIN (GLUCOPHAGE) 500 MG tablet; Take 1 tablet (500 mg total) by mouth daily with lunch.   Dietary modifications discussed with nutritional counseling provided Last a1c 9% Lab Results  Component Value Date   HGBA1C 10.5 05/06/2017   Pt currently on pioglitazone-metformin 15-575m breakfast and dinner Glipizide XL 163m24 hr tablet Will add metformin 50062mt lunch time to bring her metformin dose to tid with actos bid with glipizide XL once daily Since patient is determined not to check her blood glucose will avoid very long acting medications with a high risk of hypoglycemia Discussed glycemic index foods  A total of 40 minutes were spent face-to-face with the patient during this encounter and over half of that time was spent on counseling and coordination of care.    [place]

## 2017-05-06 NOTE — Patient Instructions (Signed)
     IF you received an x-ray today, you will receive an invoice from [facility]. Please contact [facility] at [phone] with questions or concerns regarding your invoice.   IF you received labwork today, you will receive an invoice from [facility]. Please contact [facility] at [phone] with questions or concerns regarding your invoice.   Our billing staff will not be able to assist you with questions regarding bills from these companies.  You will be contacted with the lab results as soon as they are available. The fastest way to get your results is to activate your My Chart account. Instructions are located on the last page of this paperwork. If you have not heard from us regarding the results in 2 weeks, please contact this office.     

## 2017-05-07 NOTE — Telephone Encounter (Signed)
Please advise on lithium dgaddy

## 2017-05-07 NOTE — Telephone Encounter (Signed)
Lithium LOV: 05/07/17 PCP: Dr Bertrum SolKrisiti Senner Pharmacy: [address] [place], [place]

## 2017-05-08 NOTE — Telephone Encounter (Signed)
Left detailed message that refills for actos, lisinopril and lithium were refilled and give pharmacy a call to make sure they are ready for pickup.  Advised to call office with any questions or concerns. Dgaddy, CMA

## 2017-05-24 ENCOUNTER — Encounter: Payer: Self-pay | Admitting: Internal Medicine

## 2017-05-28 ENCOUNTER — Encounter: Payer: Self-pay | Admitting: Family Medicine

## 2017-05-28 ENCOUNTER — Ambulatory Visit: Payer: 59 | Admitting: Family Medicine

## 2017-05-28 ENCOUNTER — Ambulatory Visit: Payer: Self-pay

## 2017-05-28 ENCOUNTER — Other Ambulatory Visit: Payer: Self-pay

## 2017-05-28 VITALS — BP 108/64 | HR 81 | Temp 97.6°F | Ht 62.99 in | Wt 123.0 lb

## 2017-05-28 DIAGNOSIS — N3001 Acute cystitis with hematuria: Secondary | ICD-10-CM

## 2017-05-28 DIAGNOSIS — R319 Hematuria, unspecified: Secondary | ICD-10-CM | POA: Diagnosis not present

## 2017-05-28 LAB — POCT URINALYSIS DIP (MANUAL ENTRY)
Bilirubin, UA: NEGATIVE
Glucose, UA: >=1000 mg/dL — AB
Ketones, POC UA: NEGATIVE mg/dL
Nitrite, UA: POSITIVE — AB
Spec Grav, UA: 1.02 (ref 1.010–1.025)
Urobilinogen, UA: 2 E.U./dL — AB
pH, UA: 5.5 (ref 5.0–8.0)

## 2017-05-28 MED ORDER — NITROFURANTOIN MONOHYD MACRO 100 MG PO CAPS: 100.0000 mg | ORAL_CAPSULE | Freq: Two times a day (BID) | ORAL | 0 refills | Status: DC

## 2017-05-28 NOTE — Progress Notes (Signed)
3/26/201911:17 AM  Amber Patterson June 11, 1955, 62 y.o. female [id number]  Chief Complaint  Patient presents with  . Hematuria    having dysuria and hematuria since Sunday    HPI:   Patient is a 62 y.o. female with past medical history significant for uncontrolled DM2 who presents today for 3 days of burning with urination, blood, mild nausea. No fevers or chills, or flank pain. No sob. No h/o recurrent UTI. Does not check cbgs at home. Last a1c several weeks ago 10.5. Sees PCP next week.  Depression screen [facility] 2/9 05/28/2017 05/06/2017 04/01/2017  Decreased Interest 0 0 0  Down, Depressed, Hopeless 0 0 0  PHQ - 2 Score 0 0 0    No Known Allergies  Prior to Admission medications   Medication Sig Start Date End Date Taking? Authorizing Provider  aspirin EC 81 MG tablet Take 81 mg by mouth daily.   Yes [provider]  GLIPIZIDE XL 10 MG 24 hr tablet TAKE 1 TABLET BY MOUTH TWICE DAILY 02/01/17  Yes Stallings, Zoe A, MD  Lancets 30G MISC Use to check blood glucose twice a day. E11.65 04/01/17  Yes Stallings, Zoe A, MD  lisinopril (PRINIVIL,ZESTRIL) 20 MG tablet TAKE 1 TABLET BY MOUTH ONCE DAILY 05/07/17  Yes Stallings, Zoe A, MD  lithium carbonate (ESKALITH) 450 MG CR tablet TAKE 1 TABLET BY MOUTH TWICE DAILY 05/08/17  Yes Stallings, Zoe A, MD  metFORMIN (GLUCOPHAGE) 500 MG tablet Take 1 tablet (500 mg total) by mouth daily with lunch. 05/06/17  Yes Stallings, Zoe A, MD  pioglitazone (ACTOS) 30 MG tablet TAKE 1 TABLET BY MOUTH ONCE DAILY 05/07/17  Yes Delia Chimes A, MD  pioglitazone-metformin (ACTOPLUS MET) 15-500 MG tablet Take 1 tablet by mouth 2 (two) times daily with a meal. 02/18/17  Yes Stallings, Zoe A, MD  pravastatin (PRAVACHOL) 40 MG tablet TAKE 1 TABLET BY MOUTH ONCE DAILY 02/01/17  Yes Stallings, Zoe A, MD  Insulin Pen Needle (PEN NEEDLES) 32G X 4 MM MISC 1 application at bedtime by Does not apply route. E11.65 Patient not taking: Reported on 05/28/2017 01/21/17    Forrest Moron, MD    Past Medical History:  Diagnosis Date  . Diabetes mellitus without complication ([place])   . High cholesterol   . Hypertension     History reviewed. No pertinent surgical history.  Social History   Tobacco Use  . Smoking status: Former Smoker    Types: Cigarettes  . Smokeless tobacco: Never Used  Substance Use Topics  . Alcohol use: No    Family History  Problem Relation Age of Onset  . Hypertension Mother   . Heart attack Sister   . Cancer - Cervical Sister     ROS Per hpi  OBJECTIVE:  Blood pressure 108/64, pulse 81, temperature 97.6 F (36.4 C), temperature source Oral, height 5' 2.99" (1.6 m), weight 123 lb (55.8 kg), SpO2 96 %.  Physical Exam  Constitutional: She is oriented to person, place, and time and well-developed, well-nourished, and in no distress.  HENT:  Head: Normocephalic and atraumatic.  Mouth/Throat: Oropharynx is clear and moist. No oropharyngeal exudate.  Eyes: Pupils are equal, round, and reactive to light. EOM are normal. No scleral icterus.  Neck: Neck supple.  Cardiovascular: Normal rate, regular rhythm and normal heart sounds. Exam reveals no gallop and no friction rub.  No murmur heard. Pulmonary/Chest: Effort normal and breath sounds normal. She has no wheezes. She has no rales.  Abdominal: There  is no CVA tenderness.  Musculoskeletal: She exhibits no edema.  Neurological: She is alert and oriented to person, place, and time. Gait normal.  Skin: Skin is warm and dry.      Results for orders placed or performed in visit on 05/28/17 (from the past 24 hour(s))  POCT urinalysis dipstick     Status: Abnormal   Collection Time: 05/28/17 11:01 AM  Result Value Ref Range   Color, UA yellow yellow   Clarity, UA cloudy (A) clear   Glucose, UA >=1,000 (A) negative mg/dL   Bilirubin, UA negative negative   Ketones, POC UA negative negative mg/dL   Spec Grav, UA 1.020 1.010 - 1.025   Blood, UA moderate (A) negative    pH, UA 5.5 5.0 - 8.0   Protein Ur, POC trace (A) negative mg/dL   Urobilinogen, UA 2.0 (A) 0.2 or 1.0 E.U./dL   Nitrite, UA Positive (A) Negative   Leukocytes, UA Small (1+) (A) Negative     ASSESSMENT and PLAN  1. Hematuria, unspecified type - POCT urinalysis dipstick  2. Acute cystitis with hematuria Discussed supportive measures, new meds r/se/b and RTC precautions. Patient educational handout given. - Urine Culture  Other orders - nitrofurantoin, macrocrystal-monohydrate, (MACROBID) 100 MG capsule; Take 1 capsule (100 mg total) by mouth 2 (two) times daily.  Return for as scheduled with Dr Nolon Rod for Diabetes.    Rutherford Guys, MD [facility] [place], [place] [address] Ph.  [phone] Fax [phone]

## 2017-05-28 NOTE — Telephone Encounter (Signed)
Pt. Reports she fell at work 2 weeks ago and was sore - had back pain last week and thought it was from the fall. Woke up this morning and is seeing blood in her urine and when she wipes. States she mild pain in her "stomach that I've never had before." Appointment today made by agent.  Answer Assessment - Initial Assessment Questions 1. COLOR of URINE: "Describe the color of the urine."  (e.g., tea-colored, pink, red, blood clots, bloody)     Red 2. ONSET: "When did the bleeding start?"      This morning 3. EPISODES: "How many times has there been blood in the urine?" or "How many times today?"     Twice 4. PAIN with URINATION: "Is there any pain with passing your urine?" If so, ask: "How bad is the pain?"  (Scale 1-10; or mild, moderate, severe)    - MILD - complains slightly about urination hurting    - MODERATE - interferes with normal activities      - SEVERE - excruciating, unwilling or unable to urinate because of the pain      Mild 5. FEVER: "Do you have a fever?" If so, ask: "What is your temperature, how was it measured, and when did it start?"     No 6. ASSOCIATED SYMPTOMS: "Are you passing urine more frequently than usual?"     No 7. OTHER SYMPTOMS: "Do you have any other symptoms?" (e.g., back/flank pain, abdominal pain, vomiting)     No 8. PREGNANCY: "Is there any chance you are pregnant?" "When was your last menstrual period?"     No  Protocols used: URINE - BLOOD IN-A-AH

## 2017-05-28 NOTE — Patient Instructions (Addendum)
     IF you received an x-ray today, you will receive an invoice from [facility]. Please contact [facility] at [phone] with questions or concerns regarding your invoice.   IF you received labwork today, you will receive an invoice from [facility]. Please contact [facility] at [phone] with questions or concerns regarding your invoice.   Our billing staff will not be able to assist you with questions regarding bills from these companies.  You will be contacted with the lab results as soon as they are available. The fastest way to get your results is to activate your My Chart account. Instructions are located on the last page of this paperwork. If you have not heard from us regarding the results in 2 weeks, please contact this office.     Urinary Tract Infection, Adult A urinary tract infection (UTI) is an infection of any part of the urinary tract. The urinary tract includes the:  Kidneys.  Ureters.  Bladder.  Urethra.  These organs make, store, and get rid of pee (urine) in the body. Follow these instructions at home:  Take over-the-counter and prescription medicines only as told by your doctor.  If you were prescribed an antibiotic medicine, take it as told by your doctor. Do not stop taking the antibiotic even if you start to feel better.  Avoid the following drinks: ? Alcohol. ? Caffeine. ? Tea. ? Carbonated drinks.  Drink enough fluid to keep your pee clear or pale yellow.  Keep all follow-up visits as told by your doctor. This is important.  Make sure to: ? Empty your bladder often and completely. Do not to hold pee for long periods of time. ? Empty your bladder before and after sex. ? Wipe from front to back after a bowel movement if you are female. Use each tissue one time when you wipe. Contact a doctor if:  You have back pain.  You have a fever.  You feel sick to your stomach (nauseous).  You throw up (vomit).  Your symptoms do  not get better after 3 days.  Your symptoms go away and then come back. Get help right away if:  You have very bad back pain.  You have very bad lower belly (abdominal) pain.  You are throwing up and cannot keep down any medicines or water. This information is not intended to replace advice given to you by your health care provider. Make sure you discuss any questions you have with your health care provider. Document Released: 08/08/2007 Document Revised: 07/28/2015 Document Reviewed: 01/10/2015 Elsevier Interactive Patient Education  2018 Elsevier Inc.  

## 2017-05-30 LAB — URINE CULTURE

## 2017-05-30 NOTE — Progress Notes (Signed)
On appropriate abx, no further action needed

## 2017-06-24 ENCOUNTER — Other Ambulatory Visit: Payer: Self-pay | Admitting: Family Medicine

## 2017-08-05 ENCOUNTER — Other Ambulatory Visit: Payer: Self-pay | Admitting: Family Medicine

## 2017-08-06 NOTE — Telephone Encounter (Signed)
Refill request for lisinopril 20 mg # 90 with one refill approved. Dgaddy, CMA

## 2017-08-12 ENCOUNTER — Ambulatory Visit: Payer: 59 | Admitting: Family Medicine

## 2017-08-12 ENCOUNTER — Other Ambulatory Visit: Payer: Self-pay

## 2017-08-12 ENCOUNTER — Encounter: Payer: Self-pay | Admitting: Family Medicine

## 2017-08-12 ENCOUNTER — Other Ambulatory Visit: Payer: Self-pay | Admitting: Family Medicine

## 2017-08-12 VITALS — BP 124/82 | HR 74 | Temp 98.9°F | Ht 62.4 in | Wt 122.2 lb

## 2017-08-12 DIAGNOSIS — I10 Essential (primary) hypertension: Secondary | ICD-10-CM | POA: Diagnosis not present

## 2017-08-12 DIAGNOSIS — E119 Type 2 diabetes mellitus without complications: Secondary | ICD-10-CM

## 2017-08-12 DIAGNOSIS — R399 Unspecified symptoms and signs involving the genitourinary system: Secondary | ICD-10-CM | POA: Diagnosis not present

## 2017-08-12 DIAGNOSIS — Z1231 Encounter for screening mammogram for malignant neoplasm of breast: Secondary | ICD-10-CM

## 2017-08-12 LAB — POCT URINALYSIS DIP (MANUAL ENTRY)
Bilirubin, UA: NEGATIVE
Glucose, UA: >=1000 mg/dL — AB
Ketones, POC UA: NEGATIVE mg/dL
LEUKOCYTES UA: NEGATIVE
NITRITE UA: NEGATIVE
PH UA: 5 (ref 5.0–8.0)
PROTEIN UA: NEGATIVE mg/dL
Spec Grav, UA: 1.02 (ref 1.010–1.025)
Urobilinogen, UA: 0.2 E.U./dL

## 2017-08-12 LAB — GLUCOSE, POCT (MANUAL RESULT ENTRY): POC Glucose: 314 mg/dl — AB (ref 70–99)

## 2017-08-12 LAB — POCT GLYCOSYLATED HEMOGLOBIN (HGB A1C): HEMOGLOBIN A1C: 7.8 % — AB (ref 4.0–5.6)

## 2017-08-12 MED ORDER — PIOGLITAZONE HCL-METFORMIN HCL 15-500 MG PO TABS: 1.0000 | ORAL_TABLET | Freq: Two times a day (BID) | ORAL | 0 refills | Status: DC

## 2017-08-12 MED ORDER — METFORMIN HCL 500 MG PO TABS: 500.0000 mg | ORAL_TABLET | Freq: Every day | ORAL | 0 refills | Status: DC

## 2017-08-12 NOTE — Progress Notes (Signed)
Chief Complaint  Patient presents with  . Follow-up    DM mangement. poor appetite and weighloss  . Medication Refill    on dm meds and Lithium    HPI  Diabetes follow up She reports that she has poor appetite and is afraid to eat. She is not sure of her medications but take metformin at lunch time She takes actoplus met in the morning and at night She eats walnuts as her snack She denies increased hunger, thirst or urination  Wt Readings from Last 3 Encounters:  08/12/17 122 lb 3.2 oz (55.4 kg)  05/28/17 123 lb (55.8 kg)  05/06/17 129 lb 6.4 oz (58.7 kg)     Component     Latest Ref Rng & Units 01/21/2017 02/18/2017 05/06/2017 08/12/2017  Hemoglobin A1C     4.0 - 5.6 % >14.0 9.0a 10.5 7.8 (A)    Hypertension: Patient here for follow-up of elevated blood pressure.  She is compliant with her meds and adheres to a DASH diet.  She is exercising and is adherent to low salt diet.  Blood pressure is well controlled at home. Cardiac symptoms none. Patient denies chest pain, chest pressure/discomfort, claudication, dyspnea and exertional chest pressure/discomfort.  Cardiovascular risk factors: diabetes mellitus and hypertension. Use of agents associated with hypertension: none. History of target organ damage: none. BP Readings from Last 3 Encounters:  08/12/17 124/82  05/28/17 108/64  05/06/17 122/64     Past Medical History:  Diagnosis Date  . Diabetes mellitus without complication ([place])   . High cholesterol   . Hypertension     Current Outpatient Medications  Medication Sig Dispense Refill  . aspirin EC 81 MG tablet Take 81 mg by mouth daily.    . Insulin Pen Needle (PEN NEEDLES) 32G X 4 MM MISC 1 application at bedtime by Does not apply route. E11.65 (Patient not taking: Reported on 05/28/2017) 100 each 3  . Lancets 30G MISC Use to check blood glucose twice a day. E11.65 100 each 11  . lisinopril (PRINIVIL,ZESTRIL) 20 MG tablet TAKE 1 TABLET BY MOUTH ONCE DAILY 90 tablet 1    . lithium carbonate (ESKALITH) 450 MG CR tablet TAKE 1 TABLET BY MOUTH TWICE DAILY 180 tablet 1  . metFORMIN (GLUCOPHAGE) 500 MG tablet Take 1 tablet (500 mg total) by mouth daily with lunch. 90 tablet 0  . pioglitazone-metformin (ACTOPLUS MET) 15-500 MG tablet Take 1 tablet by mouth 2 (two) times daily with a meal. 180 tablet 0  . pravastatin (PRAVACHOL) 40 MG tablet TAKE 1 TABLET BY MOUTH ONCE DAILY 30 tablet 1   No current facility-administered medications for this visit.     Allergies: No Known Allergies  History reviewed. No pertinent surgical history.  Social History   Socioeconomic History  . Marital status: Married    Spouse name: Not on file  . Number of children: Not on file  . Years of education: Not on file  . Highest education level: Not on file  Occupational History  . Not on file  Social Needs  . Financial resource strain: Not on file  . Food insecurity:    Worry: Not on file    Inability: Not on file  . Transportation needs:    Medical: Not on file    Non-medical: Not on file  Tobacco Use  . Smoking status: Former Smoker    Types: Cigarettes  . Smokeless tobacco: Never Used  Substance and Sexual Activity  . Alcohol use: No  . Drug  use: No    Frequency: 3.0 times per week  . Sexual activity: Never  Lifestyle  . Physical activity:    Days per week: Not on file    Minutes per session: Not on file  . Stress: Not on file  Relationships  . Social connections:    Talks on phone: Not on file    Gets together: Not on file    Attends religious service: Not on file    Active member of club or organization: Not on file    Attends meetings of clubs or organizations: Not on file    Relationship status: Not on file  Other Topics Concern  . Not on file  Social History Narrative  . Not on file    Family History  Problem Relation Age of Onset  . Hypertension Mother   . Heart attack Sister   . Cancer - Cervical Sister      ROS Review of Systems See  HPI Constitution: No fevers or chills No malaise No diaphoresis Skin: No rash or itching Eyes: no blurry vision, no double vision GU: no dysuria or hematuria Neuro: no dizziness or headaches  all others reviewed and negative   Objective: Vitals:   08/12/17 1017  BP: 124/82  Pulse: 74  Temp: 98.9 F (37.2 C)  TempSrc: Oral  SpO2: 98%  Weight: 122 lb 3.2 oz (55.4 kg)  Height: 5' 2.4" (1.585 m)    Physical Exam Physical Exam  Constitutional: She is oriented to person, place, and time. She appears well-developed and well-nourished.  HENT:  Head: Normocephalic and atraumatic.  Eyes: Conjunctivae and EOM are normal.  Cardiovascular: Normal rate, regular rhythm and normal heart sounds.   Pulmonary/Chest: Effort normal and breath sounds normal. No respiratory distress. She has no wheezes.  Abdominal: Normal appearance and bowel sounds are normal. There is no tenderness. There is no CVA tenderness.  Neurological: She is alert and oriented to person, place, and time.    Assessment and Plan Bri was seen today for follow-up and medication refill.  Diagnoses and all orders for this visit:  Symptoms involving urinary system -     POCT urinalysis dipstick  Type 2 diabetes mellitus without complication, without long-term current use of insulin (HCC) -     POCT glycosylated hemoglobin (Hb A1C) -     POCT glucose (manual entry) Much improved hemoglobin a1c improved Continue exercise On metformin and actos On ace or arb On asa 57m Emphasized importance of eye and dental exam    Essential hypertension -  continue current medications - DASH diet reviewed - discussed monitoring schedule for renal function - advised ways to prevent cardiovascular disease    Other orders -     metFORMIN (GLUCOPHAGE) 500 MG tablet; Take 1 tablet (500 mg total) by mouth daily with lunch. -     pioglitazone-metformin (ACTOPLUS MET) 15-500 MG tablet; Take 1 tablet by mouth 2 (two) times  daily with a meal.     Trenell Concannon A Johm Pfannenstiel

## 2017-08-12 NOTE — Patient Instructions (Signed)
     IF you received an x-ray today, you will receive an invoice from [facility]. Please contact [facility] at [phone] with questions or concerns regarding your invoice.   IF you received labwork today, you will receive an invoice from [facility]. Please contact [facility] at [phone] with questions or concerns regarding your invoice.   Our billing staff will not be able to assist you with questions regarding bills from these companies.  You will be contacted with the lab results as soon as they are available. The fastest way to get your results is to activate your My Chart account. Instructions are located on the last page of this paperwork. If you have not heard from us regarding the results in 2 weeks, please contact this office.     

## 2017-09-02 ENCOUNTER — Ambulatory Visit
Admission: RE | Admit: 2017-09-02 | Discharge: 2017-09-02 | Disposition: A | Discharge status: Home / Self Care | Payer: 59 | Source: Ambulatory Visit | Attending: Family Medicine | Admitting: Family Medicine

## 2017-09-02 DIAGNOSIS — Z1231 Encounter for screening mammogram for malignant neoplasm of breast: Secondary | ICD-10-CM

## 2017-09-03 ENCOUNTER — Encounter: Payer: Self-pay | Admitting: Family Medicine

## 2017-09-26 ENCOUNTER — Encounter: Payer: Self-pay | Admitting: Family Medicine

## 2017-09-26 ENCOUNTER — Ambulatory Visit: Payer: 59 | Admitting: Family Medicine

## 2017-09-26 ENCOUNTER — Other Ambulatory Visit: Payer: Self-pay

## 2017-09-26 VITALS — BP 108/72 | HR 93 | Temp 99.2°F | Resp 16 | Ht 62.4 in | Wt 118.4 lb

## 2017-09-26 DIAGNOSIS — H00011 Hordeolum externum right upper eyelid: Secondary | ICD-10-CM | POA: Diagnosis not present

## 2017-09-26 DIAGNOSIS — H1031 Unspecified acute conjunctivitis, right eye: Secondary | ICD-10-CM

## 2017-09-26 MED ORDER — POLYMYXIN B-TRIMETHOPRIM 10000-0.1 UNIT/ML-% OP SOLN: 1.0000 [drp] | OPHTHALMIC | 0 refills | Status: AC

## 2017-09-26 NOTE — Patient Instructions (Addendum)
Use the compress for 15 minutes four times per day to help with drainage.  Massage the eye and gentle wiping of the eye lid after warm compress can aid drainage.  If it does not drain after 2 weeks please return to clinic.     IF you received an x-ray today, you will receive an invoice from [facility]. Please contact [facility] at [phone] with questions or concerns regarding your invoice.   IF you received labwork today, you will receive an invoice from [facility]. Please contact [facility] at [phone] with questions or concerns regarding your invoice.   Our billing staff will not be able to assist you with questions regarding bills from these companies.  You will be contacted with the lab results as soon as they are available. The fastest way to get your results is to activate your My Chart account. Instructions are located on the last page of this paperwork. If you have not heard from [place] regarding the results in 2 weeks, please contact this office.    Stye A stye is a bump on your eyelid caused by a bacterial infection. A stye can form inside the eyelid (internal stye) or outside the eyelid (external stye). An internal stye may be caused by an infected oil-producing gland inside your eyelid. An external stye may be caused by an infection at the base of your eyelash (hair follicle). Styes are very common. Anyone can get them at any age. They usually occur in just one eye, but you may have more than one in either eye. What are the causes? The infection is almost always caused by bacteria called Staphylococcus aureus. This is a common type of bacteria that lives on your skin. What increases the risk? You may be at higher risk for a stye if you have had one before. You may also be at higher risk if you have:  Diabetes.  Long-term illness.  Long-term eye redness.  A skin condition called seborrhea.  High fat levels in your blood (lipids).  What are the signs or  symptoms? Eyelid pain is the most common symptom of a stye. Internal styes are more painful than external styes. Other signs and symptoms may include:  Painful swelling of your eyelid.  A scratchy feeling in your eye.  Tearing and redness of your eye.  Pus draining from the stye.  How is this diagnosed? Your health care provider may be able to diagnose a stye just by examining your eye. The health care provider may also check to make sure:  You do not have a fever or other signs of a more serious infection.  The infection has not spread to other parts of your eye or areas around your eye.  How is this treated? Most styes will clear up in a few days without treatment. In some cases, you may need to use antibiotic drops or ointment to prevent infection. Your health care provider may have to drain the stye surgically if your stye is:  Large.  Causing a lot of pain.  Interfering with your vision.  This can be done using a thin blade or a needle. Follow these instructions at home:  Take medicines only as directed by your health care provider.  Apply a clean, warm compress to your eye for 10 minutes, 4 times a day.  Do not wear contact lenses or eye makeup until your stye has healed.  Do not try to pop or drain the stye. Contact a health care provider if:  You have chills or a fever.  Your stye does not go away after several days.  Your stye affects your vision.  Your eyeball becomes swollen, red, or painful. This information is not intended to replace advice given to you by your health care provider. Make sure you discuss any questions you have with your health care provider. Document Released: 11/29/2004 Document Revised: 10/16/2015 Document Reviewed: 06/05/2013 Elsevier Interactive Patient Education  [employer]2018 [employer]

## 2017-09-26 NOTE — Progress Notes (Signed)
Chief Complaint  Patient presents with  . right eye swollen, Possible stye, onset: last week and worse    Tried hot compresses and stye gtts and worsened eye.    HPI   Pt reports that she has been having pain in her upper eye lid on the right from swelling She states that there is some dry grittiness to the eye She reports normal vision  She has been using some warm compress and otc eye drops   4 review of systems  Past Medical History:  Diagnosis Date  . Diabetes mellitus without complication ([place])   . High cholesterol   . Hypertension     Current Outpatient Medications  Medication Sig Dispense Refill  . aspirin EC 81 MG tablet Take 81 mg by mouth daily.    Marland Kitchen lisinopril (PRINIVIL,ZESTRIL) 20 MG tablet TAKE 1 TABLET BY MOUTH ONCE DAILY 90 tablet 1  . lithium carbonate (ESKALITH) 450 MG CR tablet TAKE 1 TABLET BY MOUTH TWICE DAILY 180 tablet 1  . metFORMIN (GLUCOPHAGE) 500 MG tablet Take 1 tablet (500 mg total) by mouth daily with lunch. 90 tablet 0  . pioglitazone-metformin (ACTOPLUS MET) 15-500 MG tablet Take 1 tablet by mouth 2 (two) times daily with a meal. 180 tablet 0  . pravastatin (PRAVACHOL) 40 MG tablet TAKE 1 TABLET BY MOUTH ONCE DAILY 30 tablet 1  . trimethoprim-polymyxin b (POLYTRIM) ophthalmic solution Place 1 drop into the right eye every 4 (four) hours for 7 days. 10 mL 0   No current facility-administered medications for this visit.     Allergies: No Known Allergies  No past surgical history on file.  Social History   Socioeconomic History  . Marital status: Married    Spouse name: Not on file  . Number of children: Not on file  . Years of education: Not on file  . Highest education level: Not on file  Occupational History  . Not on file  Social Needs  . Financial resource strain: Not on file  . Food insecurity:    Worry: Not on file    Inability: Not on file  . Transportation needs:    Medical: Not on file    Non-medical: Not on file    Tobacco Use  . Smoking status: Former Smoker    Types: Cigarettes  . Smokeless tobacco: Never Used  Substance and Sexual Activity  . Alcohol use: No  . Drug use: No    Frequency: 3.0 times per week  . Sexual activity: Never  Lifestyle  . Physical activity:    Days per week: Not on file    Minutes per session: Not on file  . Stress: Not on file  Relationships  . Social connections:    Talks on phone: Not on file    Gets together: Not on file    Attends religious service: Not on file    Active member of club or organization: Not on file    Attends meetings of clubs or organizations: Not on file    Relationship status: Not on file  Other Topics Concern  . Not on file  Social History Narrative  . Not on file    Family History  Problem Relation Age of Onset  . Hypertension Mother   . Heart attack Sister   . Cancer - Cervical Sister      ROS Review of Systems See HPI Constitution: No fevers or chills No malaise No diaphoresis Skin: No rash or itching Eyes: no blurry vision,  no double vision GU: no dysuria or hematuria Neuro: no dizziness or headaches all others reviewed and negative   Objective: Vitals:   09/26/17 1003  BP: 108/72  Pulse: 93  Resp: 16  Temp: 99.2 F (37.3 C)  TempSrc: Oral  SpO2: 98%  Weight: 118 lb 6.4 oz (53.7 kg)  Height: 5' 2.4" (1.585 m)    Physical Exam General: alert, oriented, in NAD Head: normocephalic, atraumatic, no sinus tenderness Eyes: Right eye: EOM intact, upper eye lid with edema, swept and no foreign bodies were identified PERRL, conjunctiva injected Left eye: normal conjunctiva, EOM intact Ears: TM clear bilaterally Nose: mucosa nonerythematous, nonedematous Throat: no pharyngeal exudate or erythema Lymph: no posterior auricular, submental or cervical lymph adenopathy Heart: normal rate, normal sinus rhythm, no murmurs Lungs: clear to auscultation bilaterally, no wheezing   Assessment and Plan Amber Patterson was  seen today for right eye swollen, possible stye, onset: last week and worse.  Diagnoses and all orders for this visit:  Hordeolum externum of right upper eyelid  Acute bacterial conjunctivitis of right eye -     trimethoprim-polymyxin b (POLYTRIM) ophthalmic solution; Place 1 drop into the right eye every 4 (four) hours for 7 days.  Gave home care instructions If no improvement in 2 weeks to return  Work note given   [place]

## 2017-11-14 NOTE — Progress Notes (Deleted)
  No chief complaint on file.   HPI  4 review of systems  Past Medical History:  Diagnosis Date  . Diabetes mellitus without complication ([place])   . High cholesterol   . Hypertension     Current Outpatient Medications  Medication Sig Dispense Refill  . aspirin EC 81 MG tablet Take 81 mg by mouth daily.    Marland Kitchen lisinopril (PRINIVIL,ZESTRIL) 20 MG tablet TAKE 1 TABLET BY MOUTH ONCE DAILY 90 tablet 1  . lithium carbonate (ESKALITH) 450 MG CR tablet TAKE 1 TABLET BY MOUTH TWICE DAILY 180 tablet 1  . metFORMIN (GLUCOPHAGE) 500 MG tablet Take 1 tablet (500 mg total) by mouth daily with lunch. 90 tablet 0  . pioglitazone-metformin (ACTOPLUS MET) 15-500 MG tablet Take 1 tablet by mouth 2 (two) times daily with a meal. 180 tablet 0  . pravastatin (PRAVACHOL) 40 MG tablet TAKE 1 TABLET BY MOUTH ONCE DAILY 30 tablet 1   No current facility-administered medications for this visit.     Allergies: No Known Allergies  No past surgical history on file.  Social History   Socioeconomic History  . Marital status: Married    Spouse name: Not on file  . Number of children: Not on file  . Years of education: Not on file  . Highest education level: Not on file  Occupational History  . Not on file  Social Needs  . Financial resource strain: Not on file  . Food insecurity:    Worry: Not on file    Inability: Not on file  . Transportation needs:    Medical: Not on file    Non-medical: Not on file  Tobacco Use  . Smoking status: Former Smoker    Types: Cigarettes  . Smokeless tobacco: Never Used  Substance and Sexual Activity  . Alcohol use: No  . Drug use: No    Frequency: 3.0 times per week  . Sexual activity: Never  Lifestyle  . Physical activity:    Days per week: Not on file    Minutes per session: Not on file  . Stress: Not on file  Relationships  . Social connections:    Talks on phone: Not on file    Gets together: Not on file    Attends religious service: Not on file   Active member of club or organization: Not on file    Attends meetings of clubs or organizations: Not on file    Relationship status: Not on file  Other Topics Concern  . Not on file  Social History Narrative  . Not on file    Family History  Problem Relation Age of Onset  . Hypertension Mother   . Heart attack Sister   . Cancer - Cervical Sister      ROS Review of Systems See HPI Constitution: No fevers or chills No malaise No diaphoresis Skin: No rash or itching Eyes: no blurry vision, no double vision GU: no dysuria or hematuria Neuro: no dizziness or headaches * all others reviewed and negative   Objective: There were no vitals filed for this visit.  Physical Exam  Assessment and Plan There are no diagnoses linked to this encounter.   Jamaree Hosier P [employer]

## 2017-11-18 ENCOUNTER — Ambulatory Visit: Payer: 59 | Admitting: Family Medicine

## 2018-01-13 ENCOUNTER — Ambulatory Visit: Payer: 59 | Admitting: Family Medicine

## 2018-01-13 ENCOUNTER — Other Ambulatory Visit: Payer: Self-pay

## 2018-01-13 ENCOUNTER — Encounter: Payer: Self-pay | Admitting: Family Medicine

## 2018-01-13 VITALS — BP 109/71 | HR 68 | Temp 98.3°F | Resp 16 | Ht 62.4 in | Wt 121.2 lb

## 2018-01-13 DIAGNOSIS — E1169 Type 2 diabetes mellitus with other specified complication: Secondary | ICD-10-CM | POA: Diagnosis not present

## 2018-01-13 DIAGNOSIS — E782 Mixed hyperlipidemia: Secondary | ICD-10-CM

## 2018-01-13 DIAGNOSIS — Z23 Encounter for immunization: Secondary | ICD-10-CM | POA: Diagnosis not present

## 2018-01-13 DIAGNOSIS — Z1159 Encounter for screening for other viral diseases: Secondary | ICD-10-CM | POA: Diagnosis not present

## 2018-01-13 DIAGNOSIS — Z780 Asymptomatic menopausal state: Secondary | ICD-10-CM

## 2018-01-13 DIAGNOSIS — E1165 Type 2 diabetes mellitus with hyperglycemia: Secondary | ICD-10-CM | POA: Diagnosis not present

## 2018-01-13 DIAGNOSIS — R5381 Other malaise: Secondary | ICD-10-CM

## 2018-01-13 DIAGNOSIS — R296 Repeated falls: Secondary | ICD-10-CM

## 2018-01-13 LAB — POCT GLYCOSYLATED HEMOGLOBIN (HGB A1C): HEMOGLOBIN A1C: 14 % — AB (ref 4.0–5.6)

## 2018-01-13 LAB — LIPID PANEL
Chol/HDL Ratio: 3.1 ratio (ref 0.0–4.4)
Cholesterol, Total: 187 mg/dL (ref 100–199)
HDL: 61 mg/dL (ref >39)
LDL CALC: 104 mg/dL — AB (ref 0–99)
Triglycerides: 111 mg/dL (ref 0–149)
VLDL CHOLESTEROL CAL: 22 mg/dL (ref 5–40)

## 2018-01-13 NOTE — Patient Instructions (Addendum)
1. Your diabetes is poorly controlled I think we need to try an injectable medication I will get you in with Endocrinology to discuss this and to improve your management of your diabetes  2. Due to the multiple falls I would like to get you in with Physical Therapy to strengthen your muscles  3. Follow up in one month for diabetes    If you have lab work done today you will be contacted with your lab results within the next 2 weeks.  If you have not heard from [place] then please contact us. The fastest way to get your results is to register for My Chart.   IF you received an x-ray today, you will receive an invoice from [facility]. Please contact [facility] at [phone] with questions or concerns regarding your invoice.   IF you received labwork today, you will receive an invoice from [facility]. Please contact [facility] at [phone] with questions or concerns regarding your invoice.   Our billing staff will not be able to assist you with questions regarding bills from these companies.  You will be contacted with the lab results as soon as they are available. The fastest way to get your results is to activate your My Chart account. Instructions are located on the last page of this paperwork. If you have not heard from [place] regarding the results in 2 weeks, please contact this office.     Deconditioning Deconditioning refers to the changes in your body that occur during a period of inactivity. The changes happen in your heart, lungs, and muscles. They decrease your ability to be active, and they make you feel tired and weak. There are three stages of deconditioning:  Mild deconditioning. At this stage, you will notice a change in your ability to do your usual exercise activities, such as running, biking, or swimming.  Moderate deconditioning. At this stage, you will notice a change in your ability to do normal everyday activities, such as walking, grocery shopping, and  doing chores.  Severe deconditioning. At this stage, you will notice a change in your ability to do minimal activity or normal self-care.  Deconditioning can occur after only a few days of inactivity. The longer the period of inactivity, the more severe the deconditioning will be, and the longer it will take to return to your previous level of functioning. What are the causes? Deconditioning is often caused by inactivity due to:  Illnesses, such as cancer, stroke, heart attack, fibromyalgia, and chronic fatigue syndrome.  Injuries, especially back injuries, broken bones, and ligament and tendon injuries.  A long stay in the hospital.  Pregnancy, especially if long periods of bed rest are needed.  What increases the risk? This condition is more likely to develop in:  People who are hospitalized.  People on bed rest.  People who are obese.  People with poor nutrition.  Elderly adults.  People with injuries or illnesses that interfere with movement and activity.  What are the signs or symptoms? Symptoms of deconditioning include:  Weakness.  Tiredness.  Shortness of breath with minor exertion.  A faster-than-normal heartbeat. You may not notice this without taking your pulse.  Pain or discomfort with activity.  Decreased strength.  Decreased sense of balance.  Decreased endurance.  Difficulty doing your usual forms of exercise.  Difficulty doing activities of daily living, such as grocery shopping or chores.  Difficulty walking around the house and doing basic self-care, such as getting to the bathroom, preparing meals, or doing  laundry.  How is this diagnosed? Deconditioning is diagnosed based on your medical history and a physical exam. During the physical exam, your health care provider will check for signs of deconditioning, such as:  Decreased size of muscles.  Decreased strength.  Trouble with balance.  Shortness of breath or abnormally increased  heart rate after minor exertion.  How is this treated? Treatment for deconditioning usually involves following a structured exercise program in which activity is increased gradually. Your health care provider will determine which exercises are right for you. The exercise program will likely include aerobic exercise and strength training:  Aerobic exercise helps improve the functioning of the heart and lungs as well as the muscles.  Strength training helps improve muscle size and strength.  Both of these types of exercise will improve your endurance. You may be referred to a physical therapist who can create a safe strengthening program for you to follow. Follow these instructions at home:  Follow the exercise program that is recommended by your health care provider or physical therapist.  Do not increase your exercise any faster than directed.  Eat a healthy diet.  Do not use any products that contain nicotine or tobacco, such as cigarettes and e-cigarettes. If you need help quitting, ask your health care provider.  Take over-the-counter and prescription medicines only as told by your health care provider.  Keep all follow-up visits as told by your health care provider. This is important. Contact a health care provider if:  You are not able to carry out the prescribed exercise program.  You are becoming more and more fatigued and weak.  You become light-headed when rising to a sitting or standing position.  Your level of endurance decreases after it has improved. Get help right away if:  You have chest pain.  You are very short of breath.  You have any episodes of passing out. This information is not intended to replace advice given to you by your health care provider. Make sure you discuss any questions you have with your health care provider. Document Released: 07/06/2013 Document Revised: 09/09/2015 Document Reviewed: 05/21/2015 Elsevier Interactive Patient Education   [employer].

## 2018-01-13 NOTE — Progress Notes (Signed)
Chief Complaint  Patient presents with  . Diabetes    f/u 08/12/17    HPI  Diabetes Mellitus: Patient presents for follow up of diabetes. Symptoms: none. Symptoms have stabilized. Patient denies foot ulcerations, hyperglycemia, hypoglycemia , increase appetite, nausea, paresthesia of the feet, polydipsia, polyuria and visual disturbances.  Evaluation to date has been included: hemoglobin A1C.  Home sugars: patient does not check sugars. Treatment to date: metformin, actoplus metformin   She is on lisinopril She takes actos with metformin She takes a statin She takes aspirin 1m  Lab Results  Component Value Date   HGBA1C 14.0 (A) 01/13/2018     Dyslipidemia: Patient presents for evaluation of lipids.  Compliance with treatment thus far has been good.  A repeat fasting lipid profile was done.  The patient does not use medications that may worsen dyslipidemias (corticosteroids, progestins, anabolic steroids, diuretics, beta-blockers, amiodarone, cyclosporine, olanzapine). The patient exercises never.  The patient is not known to have coexisting coronary artery disease.    Body mass index is 21.88 kg/m. Wt Readings from Last 3 Encounters:  01/13/18 121 lb 3.2 oz (55 kg)  09/26/17 118 lb 6.4 oz (53.7 kg)  08/12/17 122 lb 3.2 oz (55.4 kg)    Hypertension: Patient here for follow-up of elevated blood pressure. She is not exercising and is not adherent to low salt diet.  Blood pressure is not well controlled at home. Cardiac symptoms none. Patient denies chest pain, claudication, dyspnea and fatigue.  Cardiovascular risk factors: diabetes mellitus, dyslipidemia and hypertension. Use of agents associated with hypertension: none. History of target organ damage: none. BP Readings from Last 3 Encounters:  01/13/18 109/71  09/26/17 108/72  08/12/17 124/82   Physical decondition She reports that she has fallen about 3 times in the past month  She states that she drags her feet She does  not pick up her knees much She feels weaker physically  She does not eat a healthy diet She does not have weakness getting out of a chair or out of bed She has some weakness going upstairs   Past Medical History:  Diagnosis Date  . Diabetes mellitus without complication ([place]   . High cholesterol   . Hypertension     Current Outpatient Medications  Medication Sig Dispense Refill  . lisinopril (PRINIVIL,ZESTRIL) 20 MG tablet TAKE 1 TABLET BY MOUTH ONCE DAILY 90 tablet 1  . lithium carbonate (ESKALITH) 450 MG CR tablet TAKE 1 TABLET BY MOUTH TWICE DAILY 180 tablet 1  . metFORMIN (GLUCOPHAGE) 500 MG tablet Take 1 tablet (500 mg total) by mouth daily with lunch. 90 tablet 0  . aspirin EC 81 MG tablet Take 81 mg by mouth daily.    . pioglitazone-metformin (ACTOPLUS MET) 15-500 MG tablet Take 1 tablet by mouth 2 (two) times daily with a meal. (Patient not taking: Reported on 01/13/2018) 180 tablet 0  . pravastatin (PRAVACHOL) 40 MG tablet TAKE 1 TABLET BY MOUTH ONCE DAILY (Patient not taking: Reported on 01/13/2018) 30 tablet 1   No current facility-administered medications for this visit.     Allergies: No Known Allergies  No past surgical history on file.  Social History   Socioeconomic History  . Marital status: Married    Spouse name: Not on file  . Number of children: Not on file  . Years of education: Not on file  . Highest education level: Not on file  Occupational History  . Not on file  Social Needs  . FEmergency planning/management officer  strain: Not on file  . Food insecurity:    Worry: Not on file    Inability: Not on file  . Transportation needs:    Medical: Not on file    Non-medical: Not on file  Tobacco Use  . Smoking status: Former Smoker    Types: Cigarettes  . Smokeless tobacco: Never Used  Substance and Sexual Activity  . Alcohol use: No  . Drug use: No    Frequency: 3.0 times per week  . Sexual activity: Never  Lifestyle  . Physical activity:    Days per week:  Not on file    Minutes per session: Not on file  . Stress: Not on file  Relationships  . Social connections:    Talks on phone: Not on file    Gets together: Not on file    Attends religious service: Not on file    Active member of club or organization: Not on file    Attends meetings of clubs or organizations: Not on file    Relationship status: Not on file  Other Topics Concern  . Not on file  Social History Narrative  . Not on file    Family History  Problem Relation Age of Onset  . Hypertension Mother   . Heart attack Sister   . Cancer - Cervical Sister      ROS Review of Systems See HPI Constitution: No fevers or chills No malaise No diaphoresis Skin: No rash or itching Eyes: no blurry vision, no double vision GU: no dysuria or hematuria Neuro: no dizziness or headaches  all others reviewed and negative   Objective: Vitals:   01/13/18 1000  BP: 109/71  Pulse: 68  Resp: 16  Temp: 98.3 F (36.8 C)  TempSrc: Oral  SpO2: 98%  Weight: 121 lb 3.2 oz (55 kg)  Height: 5' 2.4" (1.585 m)    Physical Exam Physical Exam  Constitutional: She is oriented to person, place, and time. She appears well-developed and well-nourished.  HENT:  Head: Normocephalic and atraumatic.  Eyes: Conjunctivae and EOM are normal.  Cardiovascular: Normal rate, regular rhythm and normal heart sounds.   Pulmonary/Chest: Effort normal and breath sounds normal. No respiratory distress. She has no wheezes.  Abdominal: Normal appearance and bowel sounds are normal. There is no tenderness. There is no CVA tenderness.  Neurological: She is alert and oriented to person, place, and time.    Assessment and Plan Avyonna was seen today for diabetes.  Diagnoses and all orders for this visit:  Need for prophylactic vaccination against Streptococcus pneumoniae (pneumococcus)-  Clinic out of stock  Pt to get at next visit -     Cancel: Pneumococcal polysaccharide vaccine 23-valent greater than  or equal to 2yo subcutaneous/IM  Type 2 diabetes mellitus with other specified complication, unspecified whether long term insulin use (HCC) -     HM Diabetes Foot Exam -     POCT glycosylated hemoglobin (Hb A1C) -     CMP14+EGFR -     Lipid panel  Need for hepatitis C screening test -     HCV Ab w/Rflx to Verification -     CMP14+EGFR  Menopause -     DG DXA FRACTURE ASSESSMENT; Future  Frequent falls Physical deconditioning -  Referral placed for PT for fall prevention and strengthening Mixed hyperlipidemia -     Lipid panel  Uncontrolled type 2 diabetes mellitus with hyperglycemia ([place])-  Discussed her uncontrolled diabetes Pt is resistant to injectables and wants  to continue the oral meds only She is adamant that she can take the pills Discussed that due to her difficulty maintaining adequate blood sugar logs or her difficulty with a diabetic diet then something like Trulicity might work better for her She declines This provider is referring her to Endocrinology Discussed that if the Endocrinologist advises the same thing she should consider it. She is agreeable to the consultation -     Ambulatory referral to diabetic education -     Ambulatory referral to Endocrinology     [place]

## 2018-01-14 LAB — CMP14+EGFR
ALT: 9 IU/L (ref 0–32)
AST: 9 IU/L (ref 0–40)
Albumin/Globulin Ratio: 1.9 (ref 1.2–2.2)
Albumin: 4.3 g/dL (ref 3.6–4.8)
Alkaline Phosphatase: 67 IU/L (ref 39–117)
BUN/Creatinine Ratio: 14 (ref 12–28)
BUN: 10 mg/dL (ref 8–27)
Bilirubin Total: 0.7 mg/dL (ref 0.0–1.2)
CALCIUM: 9.2 mg/dL (ref 8.7–10.3)
CO2: 23 mmol/L (ref 20–29)
Chloride: 101 mmol/L (ref 96–106)
Creatinine, Ser: 0.72 mg/dL (ref 0.57–1.00)
GFR, EST AFRICAN AMERICAN: 104 mL/min/{1.73_m2} (ref >59)
GFR, EST NON AFRICAN AMERICAN: 90 mL/min/{1.73_m2} (ref >59)
GLUCOSE: 340 mg/dL — AB (ref 65–99)
Globulin, Total: 2.3 g/dL (ref 1.5–4.5)
Potassium: 4.4 mmol/L (ref 3.5–5.2)
Sodium: 137 mmol/L (ref 134–144)
TOTAL PROTEIN: 6.6 g/dL (ref 6.0–8.5)

## 2018-01-14 LAB — HCV INTERPRETATION

## 2018-01-14 LAB — HCV AB W/RFLX TO VERIFICATION

## 2018-01-27 ENCOUNTER — Encounter: Payer: Self-pay | Admitting: Endocrinology

## 2018-01-27 ENCOUNTER — Ambulatory Visit: Payer: 59 | Admitting: Endocrinology

## 2018-01-27 VITALS — BP 138/70 | HR 83 | Ht 62.0 in | Wt 122.6 lb

## 2018-01-27 DIAGNOSIS — E1142 Type 2 diabetes mellitus with diabetic polyneuropathy: Secondary | ICD-10-CM | POA: Diagnosis not present

## 2018-01-27 MED ORDER — GLIMEPIRIDE 4 MG PO TABS: 4.0000 mg | ORAL_TABLET | Freq: Every day | ORAL | 11 refills | Status: DC

## 2018-01-27 MED ORDER — PIOGLITAZONE HCL-METFORMIN HCL 15-500 MG PO TABS: 1.0000 | ORAL_TABLET | Freq: Two times a day (BID) | ORAL | 0 refills | Status: DC

## 2018-01-27 NOTE — Patient Instructions (Addendum)
good diet and exercise significantly improve the control of your diabetes.  please let me know if you wish to be referred to a dietician.  high blood sugar is very risky to your health.  you should see an eye doctor and dentist every year.  It is very important to get all recommended vaccinations.  Controlling your blood pressure and cholesterol drastically reduces the damage diabetes does to your body.  Those who smoke should quit.  Please discuss these with your doctor.  check your blood sugar once a day.  vary the time of day when you check, between before the 3 meals, and at bedtime.  also check if you have symptoms of your blood sugar being too high or too low.  please keep a record of the readings and bring it to your next appointment here (or you can bring the meter itself).  You can write it on any piece of paper.  please call us sooner if your blood sugar goes below 70, or if you have a lot of readings over 200. check your blood sugar once a day.  vary the time of day when you check, between before the 3 meals, and at bedtime.  also check if you have symptoms of your blood sugar being too high or too low.  please keep a record of the readings and bring it to your next appointment here (or you can bring the meter itself).  You can write it on any piece of paper.  please call us sooner if your blood sugar goes below 70, or if you have a lot of readings over 200.  I have sent a prescription to your pharmacy, to add "glimepiride."  Please come back for a follow-up appointment in 1-2 weeks.

## 2018-01-27 NOTE — Progress Notes (Signed)
Subjective:    Patient ID: Amber Patterson, female    DOB: 1955-10-10, 62 y.o.   MRN: [id number]  HPI pt is referred by Dr Creta Levin, for diabetes.  Pt states DM was dx'ed in 2011; she has mild neuropathy of the lower extremities; she is unaware of any associated chronic complications; she has never been on insulin; pt says her diet and exercise are good; she has never had GDM, pancreatitis, pancreatic surgery, severe hypoglycemia or DKA.  Pt says she takes pioglitazone-metformin inconsistently.   Past Medical History:  Diagnosis Date  . Diabetes mellitus without complication (HCC)   . High cholesterol   . Hypertension     No past surgical history on file.  Social History   Socioeconomic History  . Marital status: Married    Spouse name: Not on file  . Number of children: Not on file  . Years of education: Not on file  . Highest education level: Not on file  Occupational History  . Not on file  Social Needs  . Financial resource strain: Not on file  . Food insecurity:    Worry: Not on file    Inability: Not on file  . Transportation needs:    Medical: Not on file    Non-medical: Not on file  Tobacco Use  . Smoking status: Former Smoker    Types: Cigarettes  . Smokeless tobacco: Never Used  Substance and Sexual Activity  . Alcohol use: No  . Drug use: No    Frequency: 3.0 times per week  . Sexual activity: Never  Lifestyle  . Physical activity:    Days per week: Not on file    Minutes per session: Not on file  . Stress: Not on file  Relationships  . Social connections:    Talks on phone: Not on file    Gets together: Not on file    Attends religious service: Not on file    Active member of club or organization: Not on file    Attends meetings of clubs or organizations: Not on file    Relationship status: Not on file  . Intimate partner violence:    Fear of current or ex partner: Not on file    Emotionally abused: Not on file    Physically abused: Not on  file    Forced sexual activity: Not on file  Other Topics Concern  . Not on file  Social History Narrative  . Not on file    Current Outpatient Medications on File Prior to Visit  Medication Sig Dispense Refill  . aspirin EC 81 MG tablet Take 81 mg by mouth daily.    Marland Kitchen lisinopril (PRINIVIL,ZESTRIL) 20 MG tablet TAKE 1 TABLET BY MOUTH ONCE DAILY 90 tablet 1  . lithium carbonate (ESKALITH) 450 MG CR tablet TAKE 1 TABLET BY MOUTH TWICE DAILY 180 tablet 1  . pravastatin (PRAVACHOL) 40 MG tablet TAKE 1 TABLET BY MOUTH ONCE DAILY 30 tablet 1  . [DISCONTINUED] pioglitazone (ACTOS) 30 MG tablet TAKE 1 TABLET BY MOUTH ONCE DAILY 30 tablet 1   No current facility-administered medications on file prior to visit.     No Known Allergies  Family History  Problem Relation Age of Onset  . Hypertension Mother   . Diabetes Mother   . Heart attack Sister   . Cancer - Cervical Sister     BP 138/70 (BP Location: Right Arm, Patient Position: Sitting, Cuff Size: Normal)   Pulse 83   Ht 5'  2" (1.575 m)   Wt 122 lb 9.6 oz (55.6 kg)   SpO2 96%   BMI 22.42 kg/m     Review of Systems denies blurry vision, headache, chest pain, sob, n/v, muscle cramps, excessive diaphoresis, memory loss, depression, rhinorrhea, and easy bruising.  She has urinary frequency.   She has lost 25 lbs x 1-2 years.  She has cold intolerance    Objective:   Physical Exam VS: see vs page GEN: no distress HEAD: head: no deformity eyes: no periorbital swelling, no proptosis external nose and ears are normal mouth: no lesion seen NECK: supple, thyroid is not enlarged CHEST WALL: no deformity LUNGS: clear to auscultation CV: reg rate and rhythm, no murmur ABD: abdomen is soft, nontender.  no hepatosplenomegaly.  not distended.  no hernia MUSCULOSKELETAL: muscle bulk and strength are grossly normal.  no obvious joint swelling.  gait is normal and steady EXTEMITIES: no deformity.  no ulcer on the feet.  feet are of  normal color and temp.  no edema PULSES: dorsalis pedis intact bilat.  no carotid bruit NEURO:  cn 2-12 grossly intact.   readily moves all 4's.  sensation is intact to touch on the feet SKIN:  Normal texture and temperature.  No rash or suspicious lesion is visible.   NODES:  None palpable at the neck PSYCH: alert, well-oriented.  Does not appear anxious nor depressed.  Lab Results  Component Value Date   HGBA1C 14.0 (A) 01/13/2018   Lab Results  Component Value Date   CREATININE 0.72 01/13/2018   BUN 10 01/13/2018   NA 137 01/13/2018   K 4.4 01/13/2018   CL 101 01/13/2018   CO2 23 01/13/2018   Lab Results  Component Value Date   TSH 0.992 11/19/2016   I have reviewed outside records, and summarized: Pt was noted to have severely elevated a1c, and referred here.  Other probs addressed were HTN, dyslipidemia, and generalized weakness  I personally reviewed electrocardiogram tracing (04/14/16): Indication: chest pain.  Impression: NSR.  Widespread inverted T-waves  No hypertrophy. Compared to earlier the same day: no significant change     Assessment & Plan:  Type 2 DM: severe exacerbation. Lean body habitus: this and other factors suggest she is evolving type 1.  We discussed.  She declines to learn about insulin.  She declines to learn about glucose monitoring.    Patient Instructions  good diet and exercise significantly improve the control of your diabetes.  please let me know if you wish to be referred to a dietician.  high blood sugar is very risky to your health.  you should see an eye doctor and dentist every year.  It is very important to get all recommended vaccinations.  Controlling your blood pressure and cholesterol drastically reduces the damage diabetes does to your body.  Those who smoke should quit.  Please discuss these with your doctor.  check your blood sugar once a day.  vary the time of day when you check, between before the 3 meals, and at bedtime.  also  check if you have symptoms of your blood sugar being too high or too low.  please keep a record of the readings and bring it to your next appointment here (or you can bring the meter itself).  You can write it on any piece of paper.  please call us sooner if your blood sugar goes below 70, or if you have a lot of readings over 200. check your blood sugar  once a day.  vary the time of day when you check, between before the 3 meals, and at bedtime.  also check if you have symptoms of your blood sugar being too high or too low.  please keep a record of the readings and bring it to your next appointment here (or you can bring the meter itself).  You can write it on any piece of paper.  please call us sooner if your blood sugar goes below 70, or if you have a lot of readings over 200.  I have sent a prescription to your pharmacy, to add "glimepiride."  Please come back for a follow-up appointment in 1-2 weeks.

## 2018-02-06 ENCOUNTER — Ambulatory Visit: Payer: Self-pay | Admitting: Endocrinology

## 2018-02-17 ENCOUNTER — Ambulatory Visit: Payer: 59 | Admitting: Family Medicine

## 2018-02-20 ENCOUNTER — Ambulatory Visit (INDEPENDENT_AMBULATORY_CARE_PROVIDER_SITE_OTHER): Payer: 59 | Admitting: Endocrinology

## 2018-02-20 ENCOUNTER — Encounter: Payer: Self-pay | Admitting: Endocrinology

## 2018-02-20 VITALS — BP 142/78 | HR 77 | Temp 97.0°F | Ht 62.0 in | Wt 127.6 lb

## 2018-02-20 DIAGNOSIS — E1142 Type 2 diabetes mellitus with diabetic polyneuropathy: Secondary | ICD-10-CM | POA: Diagnosis not present

## 2018-02-20 MED ORDER — GLIMEPIRIDE 4 MG PO TABS: 4.0000 mg | ORAL_TABLET | Freq: Every day | ORAL | 11 refills | Status: AC

## 2018-02-20 MED ORDER — PIOGLITAZONE HCL-METFORMIN HCL 15-500 MG PO TABS: 1.0000 | ORAL_TABLET | Freq: Two times a day (BID) | ORAL | 0 refills | Status: DC

## 2018-02-20 MED ORDER — SITAGLIPTIN PHOSPHATE 100 MG PO TABS: 100.0000 mg | ORAL_TABLET | Freq: Every day | ORAL | 11 refills | Status: DC

## 2018-02-20 NOTE — Progress Notes (Signed)
Subjective:    Patient ID: Amber Patterson, female    DOB: 03/02/1956, 62 y.o.   MRN: [id number]  HPI  Pt returns for f/u of diabetes mellitus: DM type: 2 (but she is prob evolving type 1) Dx'ed: 2011 Complications: polyneuropathy Therapy: 3 oral meds.   GDM: never DKA: never Severe hypoglycemia: never Pancreatitis: never Pancreatic imaging: never Other: she has never been on insulin; she declines to learn about insulin, and to learn about glucose monitoring.  Interval history: pt says she sometimes misses the meds.  pt states she feels well in general. Past Medical History:  Diagnosis Date  . Diabetes mellitus without complication (HCC)   . High cholesterol   . Hypertension     No past surgical history on file.  Social History   Socioeconomic History  . Marital status: Married    Spouse name: Not on file  . Number of children: Not on file  . Years of education: Not on file  . Highest education level: Not on file  Occupational History  . Not on file  Social Needs  . Financial resource strain: Not on file  . Food insecurity:    Worry: Not on file    Inability: Not on file  . Transportation needs:    Medical: Not on file    Non-medical: Not on file  Tobacco Use  . Smoking status: Former Smoker    Types: Cigarettes  . Smokeless tobacco: Never Used  Substance and Sexual Activity  . Alcohol use: No  . Drug use: No    Frequency: 3.0 times per week  . Sexual activity: Never  Lifestyle  . Physical activity:    Days per week: Not on file    Minutes per session: Not on file  . Stress: Not on file  Relationships  . Social connections:    Talks on phone: Not on file    Gets together: Not on file    Attends religious service: Not on file    Active member of club or organization: Not on file    Attends meetings of clubs or organizations: Not on file    Relationship status: Not on file  . Intimate partner violence:    Fear of current or ex partner: Not on file      Emotionally abused: Not on file    Physically abused: Not on file    Forced sexual activity: Not on file  Other Topics Concern  . Not on file  Social History Narrative  . Not on file    Current Outpatient Medications on File Prior to Visit  Medication Sig Dispense Refill  . aspirin EC 81 MG tablet Take 81 mg by mouth daily.    Marland Kitchen. lisinopril (PRINIVIL,ZESTRIL) 20 MG tablet TAKE 1 TABLET BY MOUTH ONCE DAILY 90 tablet 1  . lithium carbonate (ESKALITH) 450 MG CR tablet TAKE 1 TABLET BY MOUTH TWICE DAILY 180 tablet 1  . pravastatin (PRAVACHOL) 40 MG tablet TAKE 1 TABLET BY MOUTH ONCE DAILY 30 tablet 1  . [DISCONTINUED] pioglitazone (ACTOS) 30 MG tablet TAKE 1 TABLET BY MOUTH ONCE DAILY 30 tablet 1   No current facility-administered medications on file prior to visit.     No Known Allergies  Family History  Problem Relation Age of Onset  . Hypertension Mother   . Diabetes Mother   . Heart attack Sister   . Cancer - Cervical Sister     BP (!) 142/78 (BP Location: Left Arm, Patient Position:  Sitting, Cuff Size: Normal)   Pulse 77   Temp (!) 97 F (36.1 C) (Oral)   Ht 5\' 2"  (1.575 m)   Wt 127 lb 9.6 oz (57.9 kg)   SpO2 97%   BMI 23.34 kg/m   Review of Systems She denies hypoglycemia    Objective:   Physical Exam VITAL SIGNS:  See vs page GENERAL: no distress Pulses: dorsalis pedis intact bilat.   MSK: no deformity of the feet CV: no leg edema Skin:  no ulcer on the feet.  normal color and temp on the feet. Neuro: sensation is intact to touch on the feet.    Lab Results  Component Value Date   CREATININE 0.72 01/13/2018   BUN 10 01/13/2018   NA 137 01/13/2018   K 4.4 01/13/2018   CL 101 01/13/2018   CO2 23 01/13/2018   Lab Results  Component Value Date   HGBA1C 14.0 (A) 01/13/2018      Assessment & Plan:  Type 2 DM, with PN: unlikely to be well-controlled Noncompliance with cbg recording: I'll work around this as best I can  Patient Instructions  check  your blood sugar once a day.  vary the time of day when you check, between before the 3 meals, and at bedtime.  also check if you have symptoms of your blood sugar being too high or too low.  please keep a record of the readings and bring it to your next appointment here (or you can bring the meter itself).  You can write it on any piece of paper.  please call us sooner if your blood sugar goes below 70, or if you have a lot of readings over 200. I have sent a prescription to your pharmacy, to add "[place]."  Please make every effort to take your medications. Using a weekly pill box will help.   Please come back for a follow-up appointment in 1 month.

## 2018-02-20 NOTE — Patient Instructions (Addendum)
check your blood sugar once a day.  vary the time of day when you check, between before the 3 meals, and at bedtime.  also check if you have symptoms of your blood sugar being too high or too low.  please keep a record of the readings and bring it to your next appointment here (or you can bring the meter itself).  You can write it on any piece of paper.  please call us sooner if your blood sugar goes below 70, or if you have a lot of readings over 200. I have sent a prescription to your pharmacy, to add "[place]uvia."  Please make every effort to take your medications. Using a weekly pill box will help.   Please come back for a follow-up appointment in 1 month.

## 2018-03-08 ENCOUNTER — Other Ambulatory Visit: Payer: Self-pay | Admitting: Family Medicine

## 2018-03-11 ENCOUNTER — Encounter: Payer: Self-pay | Admitting: Family Medicine

## 2018-03-12 ENCOUNTER — Telehealth: Payer: Self-pay

## 2018-03-12 NOTE — Telephone Encounter (Signed)
Lab letter sent via mail to pt home address. Dgaddy, CMA 

## 2018-03-25 ENCOUNTER — Encounter: Payer: Self-pay | Admitting: Nutrition

## 2018-03-25 ENCOUNTER — Ambulatory Visit: Payer: Self-pay | Admitting: Endocrinology

## 2018-03-28 ENCOUNTER — Other Ambulatory Visit: Payer: Self-pay | Admitting: Family Medicine

## 2018-03-31 NOTE — Telephone Encounter (Signed)
Requested medication (s) are due for refill today: Lithium ?  Requested medication (s) are on the active medication list: yes  Last refill:  05/06/17  Future visit scheduled: no  Notes to clinic:  Not delegated    Requested medication (s) are due for refill today: pioglitazone-metformin  Requested medication (s) are on the active medication list: yes  Last refill:  02/21/28  Future visit scheduled: no  Notes to clinic:  Last script written by Dr Loanne Drilling Requested Prescriptions  Pending Prescriptions Disp Refills  . pioglitazone-metformin (ACTOPLUS MET) 15-500 MG tablet [Pharmacy Med Name: Pioglitazone HCl-metFORMIN HCl 15-500 MG Oral Tablet] 180 tablet 0    Sig: TAKE 1 TABLET BY MOUTH TWICE DAILY WITH MEALS     Endocrinology:  Diabetes - Biguanide + Pioglitazone Combo Failed - 03/28/2018  6:18 PM      Failed - HBA1C is between 0 and 7.9 and within 180 days    Hemoglobin A1C  Date Value Ref Range Status  01/13/2018 14.0 (A) 4.0 - 5.6 % Final   Hgb A1c MFr Bld  Date Value Ref Range Status  11/19/2016 14.8 (H) 4.8 - 5.6 % Final    Comment:             Prediabetes: 5.7 - 6.4          Diabetes: >6.4          Glycemic control for adults with diabetes: <7.0          Passed - Cr in normal range and within 360 days    Creatinine, Ser  Date Value Ref Range Status  01/13/2018 0.72 0.57 - 1.00 mg/dL Final         Passed - eGFR in normal range and within 360 days    GFR calc Af Amer  Date Value Ref Range Status  01/13/2018 104 >59 mL/min/1.73 Final   GFR calc non Af Amer  Date Value Ref Range Status  01/13/2018 90 >59 mL/min/1.73 Final         Passed - Valid encounter within last 6 months    Recent Outpatient Visits          2 months ago Uncontrolled type 2 diabetes mellitus with hyperglycemia ([place])   Primary Care at [place], MD   6 months ago Hordeolum externum of right upper eyelid   Primary Care at [facility], Zoe A, MD   7 months ago  Symptoms involving urinary system   Primary Care at [facility], [place] A, MD   10 months ago Hematuria, unspecified type   [facility] at [facility], Lilia Argue, MD   10 months ago Type 2 diabetes mellitus without complication, without long-term current use of insulin ([place])   Primary Care at [facility], [place] A, MD           . lithium carbonate (ESKALITH) 450 MG CR tablet [Pharmacy Med Name: Lithium Carbonate ER 450 MG Oral Tablet Extended Release] 180 tablet 0    Sig: TAKE 1 TABLET BY MOUTH TWICE DAILY     Not Delegated - Psychiatry:  Antimanics/Bipolar Agents Failed - 03/28/2018  6:18 PM      Failed - This refill cannot be delegated      Failed - TSH in normal range and within 180 days    TSH  Date Value Ref Range Status  11/19/2016 0.992 0.450 - 4.500 uIU/mL Final         Failed - Lithium Level in normal range  and within 180 days    No results found for: POCLITH, [place] in normal range and within 180 days    Creatinine, Ser  Date Value Ref Range Status  01/13/2018 0.72 0.57 - 1.00 mg/dL Final         Passed - Valid encounter within last 6 months    Recent Outpatient Visits          2 months ago Uncontrolled type 2 diabetes mellitus with hyperglycemia ([place])   Primary Care at [place], MD   6 months ago Hordeolum externum of right upper eyelid   Primary Care at [facility], [place] A, MD   7 months ago Symptoms involving urinary system   Primary Care at [facility], [place] A, MD   10 months ago Hematuria, unspecified type   Primary Care at [facility], Lilia Argue, MD   10 months ago Type 2 diabetes mellitus without complication, without long-term current use of insulin [facility])   Primary Care at [facility], Arlie Solomons, MD

## 2018-04-01 ENCOUNTER — Other Ambulatory Visit: Payer: Self-pay | Admitting: Family Medicine

## 2018-04-01 MED ORDER — LISINOPRIL 20 MG PO TABS: 20.0000 mg | ORAL_TABLET | Freq: Every day | ORAL | 0 refills | Status: DC

## 2018-04-01 NOTE — Telephone Encounter (Signed)
Copied from CRM [id number]. Topic: Quick Communication - Rx Refill/Question >> Apr 01, 2018  5:43 PM Jilda Roche wrote: Medication: lisinopril (PRINIVIL,ZESTRIL) 20 MG tablet  Has the patient contacted their pharmacy? No. (Agent: If no, request that the patient contact the pharmacy for the refill.) Patient is out of medication and has an appt scheduled with Dr. Creta Levin on March 30th (Agent: If yes, when and what did the pharmacy advise?)  Preferred Pharmacy (with phone number or street name): [facility] [place], [place] - [address] [phone] (Phone) [phone] (Fax)    Agent: Please be advised that RX refills may take up to 3 business days. We ask that you follow-up with your pharmacy.

## 2018-04-01 NOTE — Telephone Encounter (Signed)
Medication refilled on 04/01/18

## 2018-04-01 NOTE — Telephone Encounter (Signed)
Pt called and notified that prescription was previously sent to requested pharmacy on 03/10/18. Pt verbalized understanding and will contact pharmacy for refill of medication.

## 2018-04-01 NOTE — Telephone Encounter (Signed)
Spoke with patient she stated that "she doesn't need med that are listed, she only need blood pressure medication.

## 2018-04-14 ENCOUNTER — Encounter: Payer: Self-pay | Admitting: Endocrinology

## 2018-04-14 ENCOUNTER — Ambulatory Visit (INDEPENDENT_AMBULATORY_CARE_PROVIDER_SITE_OTHER): Payer: 59 | Admitting: Endocrinology

## 2018-04-14 ENCOUNTER — Other Ambulatory Visit: Payer: Self-pay

## 2018-04-14 VITALS — BP 110/80 | HR 78 | Temp 98.9°F | Resp 16 | Ht 62.0 in | Wt 126.1 lb

## 2018-04-14 DIAGNOSIS — E1142 Type 2 diabetes mellitus with diabetic polyneuropathy: Secondary | ICD-10-CM

## 2018-04-14 LAB — POCT GLYCOSYLATED HEMOGLOBIN (HGB A1C): Hemoglobin A1C: 10.3 % — AB (ref 4.0–5.6)

## 2018-04-14 NOTE — Progress Notes (Signed)
Subjective:    Patient ID: Amber Patterson, female    DOB: 1955-10-25, 63 y.o.   MRN: [id number]  HPI Pt returns for f/u of diabetes mellitus: DM type: 2 (but she is prob evolving type 1) Dx'ed: 7471 Complications: polyneuropathy Therapy: 4 oral meds.   GDM: never DKA: never Severe hypoglycemia: never Pancreatitis: never Pancreatic imaging: never Other: she has never been on insulin; she declines to learn about insulin, and to learn about glucose monitoring.   Interval history: pt says she sometimes misses the meds.  pt states she feels well in general.   Past Medical History:  Diagnosis Date  . Diabetes mellitus without complication ([place])   . High cholesterol   . Hypertension     History reviewed. No pertinent surgical history.  Social History   Socioeconomic History  . Marital status: Married    Spouse name: Not on file  . Number of children: Not on file  . Years of education: Not on file  . Highest education level: Not on file  Occupational History  . Not on file  Social Needs  . Financial resource strain: Not on file  . Food insecurity:    Worry: Not on file    Inability: Not on file  . Transportation needs:    Medical: Not on file    Non-medical: Not on file  Tobacco Use  . Smoking status: Former Smoker    Types: Cigarettes  . Smokeless tobacco: Never Used  Substance and Sexual Activity  . Alcohol use: No  . Drug use: No    Frequency: 3.0 times per week  . Sexual activity: Never  Lifestyle  . Physical activity:    Days per week: Not on file    Minutes per session: Not on file  . Stress: Not on file  Relationships  . Social connections:    Talks on phone: Not on file    Gets together: Not on file    Attends religious service: Not on file    Active member of club or organization: Not on file    Attends meetings of clubs or organizations: Not on file    Relationship status: Not on file  . Intimate partner violence:    Fear of current or ex  partner: Not on file    Emotionally abused: Not on file    Physically abused: Not on file    Forced sexual activity: Not on file  Other Topics Concern  . Not on file  Social History Narrative  . Not on file    Current Outpatient Medications on File Prior to Visit  Medication Sig Dispense Refill  . aspirin EC 81 MG tablet Take 81 mg by mouth daily.    Marland Kitchen glimepiride (AMARYL) 4 MG tablet Take 1 tablet (4 mg total) by mouth daily with breakfast. 30 tablet 11  . lisinopril (PRINIVIL,ZESTRIL) 20 MG tablet Take 1 tablet (20 mg total) by mouth daily. 45 tablet 0  . lithium carbonate (ESKALITH) 450 MG CR tablet TAKE 1 TABLET BY MOUTH TWICE DAILY 180 tablet 1  . pioglitazone-metformin (ACTOPLUS MET) 15-500 MG tablet Take 1 tablet by mouth 2 (two) times daily with a meal. 180 tablet 0  . pravastatin (PRAVACHOL) 40 MG tablet TAKE 1 TABLET BY MOUTH ONCE DAILY 30 tablet 1  . sitaGLIPtin (JANUVIA) 100 MG tablet Take 1 tablet (100 mg total) by mouth daily. 30 tablet 11  . [DISCONTINUED] pioglitazone (ACTOS) 30 MG tablet TAKE 1 TABLET BY MOUTH ONCE  DAILY 30 tablet 1   No current facility-administered medications on file prior to visit.     No Known Allergies  Family History  Problem Relation Age of Onset  . Hypertension Mother   . Diabetes Mother   . Heart attack Sister   . Cancer - Cervical Sister     BP 110/80   Pulse 78   Temp 98.9 F (37.2 C) (Oral)   Resp 16   Ht 5' 2"  (1.575 m)   Wt 126 lb 2 oz (57.2 kg)   SpO2 97%   BMI 23.07 kg/m    Review of Systems Denies sob    Objective:   Physical Exam VITAL SIGNS:  See vs page GENERAL: no distress Pulses: dorsalis pedis intact bilat.   MSK: no deformity of the feet CV: no leg edema Skin:  no ulcer on the feet.  normal color and temp on the feet. Neuro: sensation is intact to touch on the feet  Lab Results  Component Value Date   HGBA1C 10.3 (A) 04/14/2018       Assessment & Plan:  Type 2 DM, with PN: ongoing poor  glycemic control.  She declines to add another oral med, or insulin.  Noncompliance with cbg recording, and advice to take insulin: We discussed risks of chronic complications.  Lean body habitus: we discussed risk of DKA.  Pt still says she won't take insulin, or even learn how to take.     Patient Instructions  check your blood sugar once a day.  vary the time of day when you check, between before the 3 meals, and at bedtime.  also check if you have symptoms of your blood sugar being too high or too low.  please keep a record of the readings and bring it to your next appointment here (or you can bring the meter itself).  You can write it on any piece of paper.  please call us sooner if your blood sugar goes below 70, or if you have a lot of readings over 200. Please continue the same medications.    Please come back for a follow-up appointment in 3 months.

## 2018-04-14 NOTE — Patient Instructions (Signed)

## 2018-04-21 ENCOUNTER — Encounter: Payer: 59 | Attending: Family Medicine | Admitting: Dietician

## 2018-04-21 ENCOUNTER — Encounter: Payer: Self-pay | Admitting: Dietician

## 2018-04-21 DIAGNOSIS — E1142 Type 2 diabetes mellitus with diabetic polyneuropathy: Secondary | ICD-10-CM | POA: Diagnosis present

## 2018-04-21 NOTE — Progress Notes (Signed)
Diabetes Self-Management Education  Visit Type: First/Initial  Appt. Start Time: 0210 Appt. End Time: 8366  04/25/2018  Amber Patterson, identified by name and date of birth, is a 63 y.o. female with a diagnosis of Diabetes: Type 2.   ASSESSMENT Patient is here today alone.  We discussed her A1C and she stated that the reason her BG was uncontrolled was she was not taking her medication how she should "got tired of taking medication".  She states that she is now taking her diabetes medication as prescribed.  She does not check her BG as shse does not like needles but will allow health care employees to check for her.  Her BG was 146 in the office today.  History includes:  HTN, HLD, neuropathy, type 2 diabetes since 2011 A1C 10.3% 04/15/2018 decreased from 14% 01/13/18 (was not taking her medication)  Medications include:  Glimipiride, ActoPlus Met, Januvia, lithium (patient stated that if she does not take the lithium she does not care for her diabetes or other health and ends up hospitalized so takes lithium consistently).  127 lbs recent weight. She would like to gain to 130 lbs Highest weight 155 lbs years ago.  Patient is here today alone.  She lives with her oldest daughter and grandson.  Her middle daughter is also living there currently.  They share shopping and cooking.  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  She walks at work (5 days per week) related to her job.    Dislikes rice, [employer] bread macaroni and cheese Likes salad and greens   Height 5' 1.5" (1.562 m), weight 127 lb (57.6 kg). Body mass index is 23.61 kg/m.  Diabetes Self-Management Education - 04/21/18 1419      Visit Information   Visit Type  First/Initial      Initial Visit   Diabetes Type  Type 2    Are you currently following a meal plan?  No    Are you taking your medications as prescribed?  Yes    Date Diagnosed  2011      Health Coping   How would you rate your overall health?   Good      Psychosocial Assessment   Patient Belief/Attitude about Diabetes  Other (comment)   "not too good"   Self-care barriers  None    Self-management support  Doctor's office;Family    Other persons present  Patient    Patient Concerns  Nutrition/Meal planning;Glycemic Control;Problem Solving;Healthy Lifestyle    Special Needs  None    Preferred Learning Style  No preference indicated    Learning Readiness  Ready    How often do you need to have someone help you when you read instructions, pamphlets, or other written materials from your doctor or pharmacy?  2 - Rarely    What is the last grade level you completed in school?  12th grade      Pre-Education Assessment   Patient understands the diabetes disease and treatment process.  Needs Instruction    Patient understands incorporating nutritional management into lifestyle.  Needs Instruction    Patient undertands incorporating physical activity into lifestyle.  Needs Instruction    Patient understands using medications safely.  Needs Instruction    Patient understands monitoring blood glucose, interpreting and using results  Needs Instruction    Patient understands prevention, detection, and treatment of acute complications.  Needs Instruction    Patient understands prevention, detection, and treatment of chronic complications.  Needs Instruction  Patient understands how to develop strategies to address psychosocial issues.  Needs Instruction    Patient understands how to develop strategies to promote health/change behavior.  Needs Instruction      Complications   Last HgB A1C per patient/outside source  10.3 %   04/14/18 decreased from 14% 01/13/18   How often do you check your blood sugar?  Patient declines    Have you had a dilated eye exam in the past 12 months?  No    Have you had a dental exam in the past 12 months?  No    Are you checking your feet?  Yes    How many days per week are you checking your feet?  2       Dietary Intake   Breakfast  skips most often    Snack (morning)  Cheddar NABS    Lunch  fried fish today OR leftovers OR occasional sandwich     Snack (afternoon)  occasional apple or cooked    Dinner  steak, corn OR spaghetti meat sauce OR baked chicken OR chicken salad with crackers    Snack (evening)  none    Beverage(s)  water, diet soda      Exercise   Exercise Type  Light (walking / raking leaves)    How many days per week to you exercise?  5    How many minutes per day do you exercise?  120    Total minutes per week of exercise  600      Patient Education   Previous Diabetes Education  No    Disease state   Definition of diabetes, type 1 and 2, and the diagnosis of diabetes    Nutrition management   Role of diet in the treatment of diabetes and the relationship between the three main macronutrients and blood glucose level;Information on hints to eating out and maintain blood glucose control.;Meal options for control of blood glucose level and chronic complications.    Physical activity and exercise   Role of exercise on diabetes management, blood pressure control and cardiac health.    Medications  Reviewed patients medication for diabetes, action, purpose, timing of dose and side effects.    Monitoring  Purpose and frequency of SMBG.;Identified appropriate SMBG and/or A1C goals.;Daily foot exams;Yearly dilated eye exam    Acute complications  Taught treatment of hypoglycemia - the 15 rule.    Chronic complications  Relationship between chronic complications and blood glucose control    Psychosocial adjustment  Worked with patient to identify barriers to care and solutions;Role of stress on diabetes    Personal strategies to promote health  Lifestyle issues that need to be addressed for better diabetes care      Individualized Goals (developed by patient)   Nutrition  General guidelines for healthy choices and portions discussed    Physical Activity  Exercise 5-7 days per week;30  minutes per day    Medications  take my medication as prescribed    Monitoring   Other (comment)   when you decide to check your BG make an appointment for training   Reducing Risk  treat hypoglycemia with 15 grams of carbs if blood glucose less than 93m/dL;get labs drawn;do foot checks daily    Health Coping  ask for help with (comment);discuss diabetes with (comment)      Post-Education Assessment   Patient understands the diabetes disease and treatment process.  Demonstrates understanding / competency    Patient understands incorporating nutritional  management into lifestyle.  Needs Review    Patient undertands incorporating physical activity into lifestyle.  Demonstrates understanding / competency    Patient understands using medications safely.  Demonstrates understanding / competency    Patient understands monitoring blood glucose, interpreting and using results  Demonstrates understanding / competency    Patient understands prevention, detection, and treatment of acute complications.  Demonstrates understanding / competency    Patient understands prevention, detection, and treatment of chronic complications.  Demonstrates understanding / competency    Patient understands how to develop strategies to address psychosocial issues.  Needs Review    Patient understands how to develop strategies to promote health/change behavior.  Needs Review      Outcomes   Future DMSE  3-4 months    Program Status  Completed       Individualized Plan for Diabetes Self-Management Training:   Learning Objective:  Patient will have a greater understanding of diabetes self-management. Patient education plan is to attend individual and/or group sessions per assessed needs and concerns.   Plan:   Patient Instructions  Continue to stay active Continue to take your medication.  Great job on being more consistent with this! Bake, grill or boil rather than fry Eat more non starchy vegetables. Meat  portion the size of a deck of cards. Eat a small breakfast, lunch, and dinner daily.     Expected Outcomes:     Education material provided: ADA Diabetes: Your Take Control Guide, Meal plan card, My Plate and Snack sheet  If problems or questions, patient to contact team via:  Phone  Future DSME appointment: 3-4 months

## 2018-04-21 NOTE — Patient Instructions (Signed)
Continue to stay active Continue to take your medication.  Great job on being more consistent with this! Bake, grill or boil rather than fry Eat more non starchy vegetables. Meat portion the size of a deck of cards. Eat a small breakfast, lunch, and dinner daily.

## 2018-05-21 ENCOUNTER — Other Ambulatory Visit: Payer: Self-pay | Admitting: Family Medicine

## 2018-05-22 NOTE — Telephone Encounter (Signed)
Requested medication (s) are due for refill today: yes  Requested medication (s) are on the active medication list: yes  Last refill:  03/08/2018  Future visit scheduled: yes  Notes to clinic:  Not delegated    Requested Prescriptions  Pending Prescriptions Disp Refills   lithium carbonate (ESKALITH) 450 MG CR tablet [Pharmacy Med Name: Lithium Carbonate ER 450 MG Oral Tablet Extended Release] 60 tablet 0    Sig: TAKE 1 TABLET BY MOUTH TWICE DAILY     Not Delegated - Psychiatry:  Antimanics/Bipolar Agents Failed - 05/21/2018  7:28 PM      Failed - This refill cannot be delegated      Failed - TSH in normal range and within 180 days    TSH  Date Value Ref Range Status  11/19/2016 0.992 0.450 - 4.500 uIU/mL Final         Failed - Lithium Level in normal range and within 180 days    No results found for: POCLITH, LITHIUM       Passed - Cr in normal range and within 180 days    Creatinine, Ser  Date Value Ref Range Status  01/13/2018 0.72 0.57 - 1.00 mg/dL Final         Passed - Valid encounter within last 6 months    Recent Outpatient Visits          4 months ago Uncontrolled type 2 diabetes mellitus with hyperglycemia ([place])   Primary Care at [facility], Zoe A, MD   7 months ago Hordeolum externum of right upper eyelid   Primary Care at [facility], Zoe A, MD   9 months ago Symptoms involving urinary system   Primary Care at [facility], Arlie Solomons, MD   11 months ago Hematuria, unspecified type   [facility] at [facility], Lilia Argue, MD   1 year ago Type 2 diabetes mellitus without complication, without long-term current use of insulin ([place])   Primary Care at [facility], Arlie Solomons, MD      Future Appointments            In 1 week Forrest Moron, MD Primary Care at [place], [facility]         Refused Prescriptions Disp Refills   pioglitazone-metformin (ACTOPLUS MET) 15-500 MG tablet [Pharmacy Med Name: Pioglitazone HCl-metFORMIN HCl 15-500 MG Oral  Tablet] 180 tablet 0    Sig: TAKE 1 TABLET BY MOUTH TWICE DAILY WITH MEALS     Endocrinology:  Diabetes - Biguanide + Pioglitazone Combo Failed - 05/21/2018  7:28 PM      Failed - HBA1C is between 0 and 7.9 and within 180 days    Hemoglobin A1C  Date Value Ref Range Status  04/14/2018 10.3 (A) 4.0 - 5.6 % Final   Hgb A1c MFr Bld  Date Value Ref Range Status  11/19/2016 14.8 (H) 4.8 - 5.6 % Final    Comment:             Prediabetes: 5.7 - 6.4          Diabetes: >6.4          Glycemic control for adults with diabetes: <7.0          Passed - Cr in normal range and within 360 days    Creatinine, Ser  Date Value Ref Range Status  01/13/2018 0.72 0.57 - 1.00 mg/dL Final         Passed - eGFR in normal range and within 360  days    GFR calc Af Amer  Date Value Ref Range Status  01/13/2018 104 >59 mL/min/1.73 Final   GFR calc non Af Amer  Date Value Ref Range Status  01/13/2018 90 >59 mL/min/1.73 Final         Passed - Valid encounter within last 6 months    Recent Outpatient Visits          4 months ago Uncontrolled type 2 diabetes mellitus with hyperglycemia ([place])   Primary Care at [place], MD   7 months ago Hordeolum externum of right upper eyelid   Primary Care at [facility], [place] A, MD   9 months ago Symptoms involving urinary system   Primary Care at [facility], [place] A, MD   11 months ago Hematuria, unspecified type   Primary Care at [facility], Lilia Argue, MD   1 year ago Type 2 diabetes mellitus without complication, without long-term current use of insulin [facility])   Primary Care at [place], MD      Future Appointments            In 1 week Forrest Moron, MD Primary Care at [place], [facility]

## 2018-05-24 ENCOUNTER — Other Ambulatory Visit: Payer: Self-pay | Admitting: Family Medicine

## 2018-05-26 NOTE — Telephone Encounter (Signed)
Patient is requesting a refill of the following medications: Requested Prescriptions   Pending Prescriptions Disp Refills  . lithium carbonate (ESKALITH) 450 MG CR tablet [Pharmacy Med Name: Lithium Carbonate ER 450 MG Oral Tablet Extended Release] 60 tablet 0    Sig: Take 1 tablet by mouth twice daily    Date of patient request: 3.21.2020 Last office visit: 01/13/2018 Date of last refill: 05/08/2017 Last refill amount: 180 RF 1  Follow up time period per chart:

## 2018-05-27 ENCOUNTER — Other Ambulatory Visit: Payer: Self-pay | Admitting: *Deleted

## 2018-05-27 DIAGNOSIS — E782 Mixed hyperlipidemia: Secondary | ICD-10-CM

## 2018-05-27 DIAGNOSIS — I10 Essential (primary) hypertension: Secondary | ICD-10-CM

## 2018-05-27 DIAGNOSIS — E1169 Type 2 diabetes mellitus with other specified complication: Secondary | ICD-10-CM

## 2018-06-02 ENCOUNTER — Ambulatory Visit (INDEPENDENT_AMBULATORY_CARE_PROVIDER_SITE_OTHER): Payer: 59 | Admitting: Family Medicine

## 2018-06-02 ENCOUNTER — Other Ambulatory Visit: Payer: Self-pay

## 2018-06-02 DIAGNOSIS — E1165 Type 2 diabetes mellitus with hyperglycemia: Secondary | ICD-10-CM | POA: Diagnosis not present

## 2018-06-02 DIAGNOSIS — I10 Essential (primary) hypertension: Secondary | ICD-10-CM | POA: Diagnosis not present

## 2018-06-02 DIAGNOSIS — L600 Ingrowing nail: Secondary | ICD-10-CM

## 2018-06-02 DIAGNOSIS — E118 Type 2 diabetes mellitus with unspecified complications: Secondary | ICD-10-CM

## 2018-06-02 DIAGNOSIS — IMO0002 Reserved for concepts with insufficient information to code with codable children: Secondary | ICD-10-CM

## 2018-06-02 MED ORDER — PRAVASTATIN SODIUM 40 MG PO TABS: 40.0000 mg | ORAL_TABLET | Freq: Every day | ORAL | 3 refills | Status: DC

## 2018-06-02 MED ORDER — PIOGLITAZONE HCL-METFORMIN HCL 15-500 MG PO TABS: 1.0000 | ORAL_TABLET | Freq: Two times a day (BID) | ORAL | 1 refills | Status: DC

## 2018-06-02 MED ORDER — CEPHALEXIN 500 MG PO CAPS: 500.0000 mg | ORAL_CAPSULE | Freq: Three times a day (TID) | ORAL | 0 refills | Status: DC

## 2018-06-02 MED ORDER — LISINOPRIL 20 MG PO TABS: 20.0000 mg | ORAL_TABLET | Freq: Every day | ORAL | 6 refills | Status: AC

## 2018-06-02 NOTE — Progress Notes (Signed)
Telemedicine Encounter- SOAP NOTE Established Patient  This telephone encounter was conducted with the patient's (or proxy's) verbal consent via audio telecommunications: yes/no: Yes Patient was instructed to have this encounter in a suitably private space; and to only have persons present to whom they give permission to participate. In addition, patient identity was confirmed by use of name plus two identifiers (DOB and address).  I discussed the limitations, risks, security and privacy concerns of performing an evaluation and management service by telephone and the availability of in person appointments. I also discussed with the patient that there may be a patient responsible charge related to this service. The patient expressed understanding and agreed to proceed.  I spent a total of TIME; 0 MIN TO 60 MIN: 20 minutes talking with the patient or their proxy.  Chief Complaint  Patient presents with  . diabetes follow up  . Medication Refill    all meds except lithium    Subjective   Amber Patterson is a 63 y.o. established patient. Telephone visit today for  HPI  Hypertension: Patient here for follow-up of elevated blood pressure. She is exercising and is adherent to low salt diet.  Blood pressure is well controlled at home. Cardiac symptoms none. Patient denies chest pressure/discomfort, dyspnea and fatigue.  Cardiovascular risk factors: diabetes mellitus and hypertension.  BP Readings from Last 3 Encounters:  04/14/18 110/80  02/20/18 (!) 142/78  01/27/18 138/70    Diabetes Mellitus: Patient presents for follow up of diabetes. Symptoms: toe nail that has pus due to ingrown nail. Symptoms have gradually worsened. Patient denies hypoglycemia , increase appetite, nausea, paresthesia of the feet, polydipsia and polyuria.  Evaluation to date has been included: hemoglobin A1C.  Home sugars: patient does not check sugars. She is on [place], amaryl, actoplus  Lab Results  Component  Value Date   HGBA1C 10.3 (A) 04/14/2018   Ingrown toenail On the right foot She is treating it by soaking it in hydrogen peroxide Washing it often  Squeezing out the pus No fevers or chills  Encounter for med refills Spoke to patient's daughter Ms. Clark who states that the medications were not dispense at the pharmacy since there is a Event organiser. She was able to get her lithium.   Patient Active Problem List   Diagnosis Date Noted  . Osteoarthritis 11/19/2016  . Chest pain 04/24/2016  . HTN (hypertension) 04/24/2016  . HLD (hyperlipidemia) 04/24/2016  . DM (diabetes mellitus), type 2 ([place]) 04/24/2016  . Bipolar disease, chronic ([place]) 04/24/2016    Past Medical History:  Diagnosis Date  . Depression   . Diabetes mellitus without complication ([place])   . High cholesterol   . Hypertension     Current Outpatient Medications  Medication Sig Dispense Refill  . aspirin EC 81 MG tablet Take 81 mg by mouth daily.    Marland Kitchen glimepiride (AMARYL) 4 MG tablet Take 1 tablet (4 mg total) by mouth daily with breakfast. 30 tablet 11  . lisinopril (PRINIVIL,ZESTRIL) 20 MG tablet Take 1 tablet (20 mg total) by mouth daily. 30 tablet 6  . lithium carbonate (ESKALITH) 450 MG CR tablet Take 1 tablet by mouth twice daily 60 tablet 6  . pioglitazone-metformin (ACTOPLUS MET) 15-500 MG tablet Take 1 tablet by mouth 2 (two) times daily with a meal. 180 tablet 1  . pravastatin (PRAVACHOL) 40 MG tablet Take 1 tablet (40 mg total) by mouth daily. 90 tablet 3  . sitaGLIPtin (JANUVIA) 100 MG tablet Take 1  tablet (100 mg total) by mouth daily. 30 tablet 11  . cephALEXin (KEFLEX) 500 MG capsule Take 1 capsule (500 mg total) by mouth 3 (three) times daily. 21 capsule 0   No current facility-administered medications for this visit.     No Known Allergies  Social History   Socioeconomic History  . Marital status: Married    Spouse name: Not on file  . Number of children: Not on file  . Years of education:  Not on file  . Highest education level: Not on file  Occupational History  . Not on file  Social Needs  . Financial resource strain: Not on file  . Food insecurity:    Worry: Not on file    Inability: Not on file  . Transportation needs:    Medical: Not on file    Non-medical: Not on file  Tobacco Use  . Smoking status: Former Smoker    Types: Cigarettes  . Smokeless tobacco: Never Used  Substance and Sexual Activity  . Alcohol use: No  . Drug use: No    Frequency: 3.0 times per week  . Sexual activity: Never  Lifestyle  . Physical activity:    Days per week: Not on file    Minutes per session: Not on file  . Stress: Not on file  Relationships  . Social connections:    Talks on phone: Not on file    Gets together: Not on file    Attends religious service: Not on file    Active member of club or organization: Not on file    Attends meetings of clubs or organizations: Not on file    Relationship status: Not on file  . Intimate partner violence:    Fear of current or ex partner: Not on file    Emotionally abused: Not on file    Physically abused: Not on file    Forced sexual activity: Not on file  Other Topics Concern  . Not on file  Social History Narrative  . Not on file    ROS Review of Systems  Constitutional: Negative for activity change, appetite change, chills and fever.  HENT: Negative for congestion, nosebleeds, trouble swallowing and voice change.   Respiratory: Negative for cough, shortness of breath and wheezing.   Gastrointestinal: Negative for diarrhea, nausea and vomiting.  Genitourinary: Negative for difficulty urinating, dysuria, flank pain and hematuria.  Musculoskeletal: Negative for back pain, joint swelling and neck pain.  Neurological: Negative for dizziness, speech difficulty, light-headedness and numbness.  See HPI. All other review of systems negative.   Objective   Vitals as reported by the patient: There were no vitals filed for this  visit.  [name] was seen today for diabetes follow up and medication refill.  Diagnoses and all orders for this visit:  Essential hypertension- bp at goal previously  Advised ambulatory monitoring -     Lipid panel; Future -     CMP14+EGFR; Future -     Microalbumin, urine; Future  Ingrown toenail- continue to clean and use topical abx Gave keflex for treatment   Diabetes mellitus type 2 with complications, uncontrolled ([place])-  Discussed diabetes mgmt Pt to return for lab in 3 months Discussed pt should check glucose with fasting goal of 120s -     CMP14+EGFR; Future -     Hemoglobin A1c; Future  Other orders -     pioglitazone-metformin (ACTOPLUS MET) 15-500 MG tablet; Take 1 tablet by mouth 2 (two) times daily with a  meal. -     lisinopril (PRINIVIL,ZESTRIL) 20 MG tablet; Take 1 tablet (20 mg total) by mouth daily. -     cephALEXin (KEFLEX) 500 MG capsule; Take 1 capsule (500 mg total) by mouth 3 (three) times daily. -     pravastatin (PRAVACHOL) 40 MG tablet; Take 1 tablet (40 mg total) by mouth daily.     I discussed the assessment and treatment plan with the patient. The patient was provided an opportunity to ask questions and all were answered. The patient agreed with the plan and demonstrated an understanding of the instructions.   The patient was advised to call back or seek an in-person evaluation if the symptoms worsen or if the condition fails to improve as anticipated.  I provided 20 minutes of non-face-to-face time during this encounter.  Forrest Moron, MD  Primary Care at [facility]

## 2018-07-14 ENCOUNTER — Ambulatory Visit: Payer: Self-pay | Admitting: Dietician

## 2018-07-14 ENCOUNTER — Ambulatory Visit: Payer: Self-pay | Admitting: Endocrinology

## 2018-07-30 ENCOUNTER — Telehealth: Payer: Self-pay

## 2018-07-30 NOTE — Telephone Encounter (Signed)
LOV 04/14/18. Canceled appt on 07/14/18. Called to reschedule appt. LVM requesting returned call

## 2018-10-10 ENCOUNTER — Other Ambulatory Visit: Payer: Self-pay | Admitting: Family Medicine

## 2018-10-10 DIAGNOSIS — Z1231 Encounter for screening mammogram for malignant neoplasm of breast: Secondary | ICD-10-CM

## 2018-10-10 DIAGNOSIS — Z136 Encounter for screening for cardiovascular disorders: Secondary | ICD-10-CM

## 2018-11-25 ENCOUNTER — Ambulatory Visit
Admission: RE | Admit: 2018-11-25 | Discharge: 2018-11-25 | Disposition: A | Discharge status: Home / Self Care | Payer: 59 | Source: Ambulatory Visit | Attending: Family Medicine | Admitting: Family Medicine

## 2018-11-25 ENCOUNTER — Other Ambulatory Visit: Payer: Self-pay

## 2018-11-25 DIAGNOSIS — Z1231 Encounter for screening mammogram for malignant neoplasm of breast: Secondary | ICD-10-CM

## 2020-01-26 ENCOUNTER — Ambulatory Visit: Payer: 59 | Admitting: Podiatry

## 2020-01-26 ENCOUNTER — Encounter: Payer: Self-pay | Admitting: Podiatry

## 2020-01-26 ENCOUNTER — Other Ambulatory Visit: Payer: Self-pay

## 2020-01-26 DIAGNOSIS — E1142 Type 2 diabetes mellitus with diabetic polyneuropathy: Secondary | ICD-10-CM

## 2020-01-26 DIAGNOSIS — M79675 Pain in left toe(s): Secondary | ICD-10-CM

## 2020-01-26 DIAGNOSIS — M2042 Other hammer toe(s) (acquired), left foot: Secondary | ICD-10-CM

## 2020-01-26 DIAGNOSIS — M2041 Other hammer toe(s) (acquired), right foot: Secondary | ICD-10-CM

## 2020-01-26 DIAGNOSIS — M79674 Pain in right toe(s): Secondary | ICD-10-CM | POA: Diagnosis not present

## 2020-01-26 DIAGNOSIS — B351 Tinea unguium: Secondary | ICD-10-CM

## 2020-01-26 NOTE — Progress Notes (Signed)
  Subjective:  Patient ID: Amber Patterson, female    DOB: February 05, 1956,  MRN: [id number]  Chief Complaint  Patient presents with  . routine foot care    diabetic foot exam. PT stated that her PCP referred her here for a foot exam because she has been having numbness in her feet     64 y.o. female presents with the above complaint. History confirmed with patient.  Has burning and numbness that is getting worse.  She is not insulin diabetic.  She did her A1c once was very high but is good now she does not know the exact value.  Objective:  Physical Exam: warm, good capillary refill, no trophic changes or ulcerative lesions and normal DP and PT pulses.  Semireducible hammertoe contractures 2 through 5 bilaterally.  She has onychomycosis x10.  She has diffuse loss of protective sensation of neuropathy distal to the ankle joint  Assessment:   1. Type 2 diabetes mellitus with diabetic polyneuropathy, without long-term current use of insulin (HCC)   2. Onychomycosis   3. Pain due to onychomycosis of toenails of both feet   4. Hammertoe of right foot   5. Hammertoe of left foot   6. Diabetic peripheral neuropathy (HCC)      Plan:  Patient was evaluated and treated and all questions answered.  Patient educated on diabetes. Discussed proper diabetic foot care and discussed risks and complications of disease. Educated patient in depth on reasons to return to the office immediately should he/she discover anything concerning or new on the feet. All questions answered. Discussed proper shoes as well.   Discussed the etiology and treatment options for the condition in detail with the patient. Educated patient on the topical and oral treatment options for mycotic nails. Recommended debridement of the nails today. Sharp and mechanical debridement performed of all painful and mycotic nails today. Nails debrided in length and thickness using a nail nipper and a mechanical burr to level of comfort.  Discussed treatment options including appropriate shoe gear. Follow up as needed for painful nails.    Return in about 3 months (around 04/27/2020) for diabetic foot exam and care.

## 2020-06-27 ENCOUNTER — Observation Stay (HOSPITAL_BASED_OUTPATIENT_CLINIC_OR_DEPARTMENT_OTHER)
Admission: EM | Admit: 2020-06-27 | Discharge: 2020-06-29 | Disposition: A | Discharge status: Home / Self Care | Payer: 59 | Attending: Family Medicine | Admitting: Family Medicine

## 2020-06-27 ENCOUNTER — Emergency Department (HOSPITAL_BASED_OUTPATIENT_CLINIC_OR_DEPARTMENT_OTHER): Payer: 59

## 2020-06-27 ENCOUNTER — Other Ambulatory Visit: Payer: Self-pay

## 2020-06-27 ENCOUNTER — Encounter (HOSPITAL_BASED_OUTPATIENT_CLINIC_OR_DEPARTMENT_OTHER): Payer: Self-pay | Admitting: Emergency Medicine

## 2020-06-27 DIAGNOSIS — E1165 Type 2 diabetes mellitus with hyperglycemia: Secondary | ICD-10-CM

## 2020-06-27 DIAGNOSIS — Z7982 Long term (current) use of aspirin: Secondary | ICD-10-CM | POA: Insufficient documentation

## 2020-06-27 DIAGNOSIS — R8271 Bacteriuria: Secondary | ICD-10-CM | POA: Insufficient documentation

## 2020-06-27 DIAGNOSIS — Z7984 Long term (current) use of oral hypoglycemic drugs: Secondary | ICD-10-CM | POA: Insufficient documentation

## 2020-06-27 DIAGNOSIS — E785 Hyperlipidemia, unspecified: Secondary | ICD-10-CM | POA: Diagnosis present

## 2020-06-27 DIAGNOSIS — I9589 Other hypotension: Secondary | ICD-10-CM

## 2020-06-27 DIAGNOSIS — Z87891 Personal history of nicotine dependence: Secondary | ICD-10-CM | POA: Insufficient documentation

## 2020-06-27 DIAGNOSIS — E1100 Type 2 diabetes mellitus with hyperosmolarity without nonketotic hyperglycemic-hyperosmolar coma (NKHHC): Secondary | ICD-10-CM | POA: Diagnosis not present

## 2020-06-27 DIAGNOSIS — Z20822 Contact with and (suspected) exposure to covid-19: Secondary | ICD-10-CM | POA: Insufficient documentation

## 2020-06-27 DIAGNOSIS — R634 Abnormal weight loss: Secondary | ICD-10-CM

## 2020-06-27 DIAGNOSIS — I10 Essential (primary) hypertension: Secondary | ICD-10-CM | POA: Diagnosis present

## 2020-06-27 DIAGNOSIS — R739 Hyperglycemia, unspecified: Secondary | ICD-10-CM

## 2020-06-27 DIAGNOSIS — N939 Abnormal uterine and vaginal bleeding, unspecified: Secondary | ICD-10-CM

## 2020-06-27 DIAGNOSIS — Z79899 Other long term (current) drug therapy: Secondary | ICD-10-CM | POA: Insufficient documentation

## 2020-06-27 DIAGNOSIS — E119 Type 2 diabetes mellitus without complications: Secondary | ICD-10-CM

## 2020-06-27 DIAGNOSIS — N390 Urinary tract infection, site not specified: Secondary | ICD-10-CM

## 2020-06-27 DIAGNOSIS — I959 Hypotension, unspecified: Secondary | ICD-10-CM | POA: Diagnosis present

## 2020-06-27 DIAGNOSIS — N938 Other specified abnormal uterine and vaginal bleeding: Principal | ICD-10-CM

## 2020-06-27 DIAGNOSIS — E1142 Type 2 diabetes mellitus with diabetic polyneuropathy: Secondary | ICD-10-CM

## 2020-06-27 DIAGNOSIS — E861 Hypovolemia: Secondary | ICD-10-CM

## 2020-06-27 DIAGNOSIS — E860 Dehydration: Secondary | ICD-10-CM

## 2020-06-27 DIAGNOSIS — F319 Bipolar disorder, unspecified: Secondary | ICD-10-CM | POA: Diagnosis present

## 2020-06-27 LAB — HEMOGLOBIN A1C
Hgb A1c MFr Bld: 13.2 % — ABNORMAL HIGH (ref 4.8–5.6)
Mean Plasma Glucose: 332.14 mg/dL

## 2020-06-27 LAB — BASIC METABOLIC PANEL
Anion gap: 7 (ref 5–15)
Anion gap: 6 (ref 5–15)
BUN: 22 mg/dL (ref 8–23)
BUN: 22 mg/dL (ref 8–23)
CO2: 20 mmol/L — ABNORMAL LOW (ref 22–32)
CO2: 17 mmol/L — ABNORMAL LOW (ref 22–32)
Calcium: 8.6 mg/dL — ABNORMAL LOW (ref 8.9–10.3)
Calcium: 8.3 mg/dL — ABNORMAL LOW (ref 8.9–10.3)
Chloride: 108 mmol/L (ref 98–111)
Chloride: 107 mmol/L (ref 98–111)
Creatinine, Ser: 1.01 mg/dL — ABNORMAL HIGH (ref 0.44–1.00)
Creatinine, Ser: 0.91 mg/dL (ref 0.44–1.00)
GFR, Estimated: >60 mL/min (ref >60)
GFR, Estimated: >60 mL/min (ref >60)
Glucose, Bld: 306 mg/dL — ABNORMAL HIGH (ref 70–99)
Glucose, Bld: 226 mg/dL — ABNORMAL HIGH (ref 70–99)
Potassium: 4.7 mmol/L (ref 3.5–5.1)
Potassium: 4.8 mmol/L (ref 3.5–5.1)
Sodium: 135 mmol/L (ref 135–145)
Sodium: 130 mmol/L — ABNORMAL LOW (ref 135–145)

## 2020-06-27 LAB — LACTIC ACID, PLASMA: Lactic Acid, Venous: 2.9 mmol/L (ref 0.5–1.9)

## 2020-06-27 LAB — COMPREHENSIVE METABOLIC PANEL
ALT: 6 U/L (ref 0–44)
AST: 5 U/L — ABNORMAL LOW (ref 15–41)
Albumin: 4.1 g/dL (ref 3.5–5.0)
Alkaline Phosphatase: 49 U/L (ref 38–126)
Anion gap: 7 (ref 5–15)
BUN: 26 mg/dL — ABNORMAL HIGH (ref 8–23)
CO2: 21 mmol/L — ABNORMAL LOW (ref 22–32)
Calcium: 9.6 mg/dL (ref 8.9–10.3)
Chloride: 97 mmol/L — ABNORMAL LOW (ref 98–111)
Creatinine, Ser: 0.9 mg/dL (ref 0.44–1.00)
GFR, Estimated: >60 mL/min (ref >60)
Glucose, Bld: 664 mg/dL (ref 70–99)
Potassium: 5.2 mmol/L — ABNORMAL HIGH (ref 3.5–5.1)
Sodium: 125 mmol/L — ABNORMAL LOW (ref 135–145)
Total Bilirubin: 0.6 mg/dL (ref 0.3–1.2)
Total Protein: 6.9 g/dL (ref 6.5–8.1)

## 2020-06-27 LAB — RESP PANEL BY RT-PCR (FLU A&B, COVID) ARPGX2
Influenza A by PCR: NEGATIVE
Influenza B by PCR: NEGATIVE
SARS Coronavirus 2 by RT PCR: NEGATIVE

## 2020-06-27 LAB — CBC
HCT: 41.2 % (ref 36.0–46.0)
Hemoglobin: 13.7 g/dL (ref 12.0–15.0)
MCH: 29.8 pg (ref 26.0–34.0)
MCHC: 33.3 g/dL (ref 30.0–36.0)
MCV: 89.6 fL (ref 80.0–100.0)
Platelets: 301 10*3/uL (ref 150–400)
RBC: 4.6 MIL/uL (ref 3.87–5.11)
RDW: 13 % (ref 11.5–15.5)
WBC: 7.4 10*3/uL (ref 4.0–10.5)
nRBC: 0 % (ref 0.0–0.2)

## 2020-06-27 LAB — CBG MONITORING, ED
Glucose-Capillary: 391 mg/dL — ABNORMAL HIGH (ref 70–99)
Glucose-Capillary: 285 mg/dL — ABNORMAL HIGH (ref 70–99)
Glucose-Capillary: 261 mg/dL — ABNORMAL HIGH (ref 70–99)

## 2020-06-27 LAB — URINALYSIS, ROUTINE W REFLEX MICROSCOPIC
Bilirubin Urine: NEGATIVE
Glucose, UA: >1000 mg/dL — AB
Ketones, ur: NEGATIVE mg/dL
Nitrite: NEGATIVE
Protein, ur: NEGATIVE mg/dL
Specific Gravity, Urine: 1.026 (ref 1.005–1.030)
WBC, UA: >50 WBC/hpf — ABNORMAL HIGH (ref 0–5)
pH: 5.5 (ref 5.0–8.0)

## 2020-06-27 LAB — HIV ANTIBODY (ROUTINE TESTING W REFLEX): HIV Screen 4th Generation wRfx: NONREACTIVE

## 2020-06-27 LAB — GLUCOSE, CAPILLARY
Glucose-Capillary: 195 mg/dL — ABNORMAL HIGH (ref 70–99)
Glucose-Capillary: 211 mg/dL — ABNORMAL HIGH (ref 70–99)
Glucose-Capillary: 214 mg/dL — ABNORMAL HIGH (ref 70–99)
Glucose-Capillary: 246 mg/dL — ABNORMAL HIGH (ref 70–99)
Glucose-Capillary: 246 mg/dL — ABNORMAL HIGH (ref 70–99)
Glucose-Capillary: 252 mg/dL — ABNORMAL HIGH (ref 70–99)
Glucose-Capillary: 287 mg/dL — ABNORMAL HIGH (ref 70–99)

## 2020-06-27 LAB — MRSA PCR SCREENING: MRSA by PCR: NEGATIVE

## 2020-06-27 LAB — BETA-HYDROXYBUTYRIC ACID: Beta-Hydroxybutyric Acid: 0.22 mmol/L (ref 0.05–0.27)

## 2020-06-27 MED ORDER — DEXTROSE 50 % IV SOLN: 0.0000 mL | INTRAVENOUS | Status: DC | PRN

## 2020-06-27 MED ORDER — SODIUM CHLORIDE 0.9 % IV SOLN
1.0000 g | INTRAVENOUS | Status: DC
  Administered 2020-06-28: 1 g via INTRAVENOUS
  Filled 2020-06-27 (×2): qty 10
  Filled 2020-06-27: qty 1

## 2020-06-27 MED ORDER — LITHIUM CARBONATE ER 450 MG PO TBCR
450.0000 mg | EXTENDED_RELEASE_TABLET | Freq: Two times a day (BID) | ORAL | Status: DC
  Administered 2020-06-27 – 2020-06-28 (×3): 450 mg via ORAL
  Filled 2020-06-27 (×5): qty 1

## 2020-06-27 MED ORDER — SODIUM CHLORIDE 0.9 % IV SOLN
1.0000 g | Freq: Once | INTRAVENOUS | Status: AC
  Administered 2020-06-27: 1 g via INTRAVENOUS
  Filled 2020-06-27: qty 10

## 2020-06-27 MED ORDER — SODIUM CHLORIDE 0.9% FLUSH
3.0000 mL | Freq: Two times a day (BID) | INTRAVENOUS | Status: DC
  Administered 2020-06-27 – 2020-06-29 (×3): 3 mL via INTRAVENOUS

## 2020-06-27 MED ORDER — ONDANSETRON HCL 4 MG PO TABS: 4.0000 mg | ORAL_TABLET | Freq: Four times a day (QID) | ORAL | Status: DC | PRN

## 2020-06-27 MED ORDER — ACETAMINOPHEN 325 MG PO TABS: 650.0000 mg | ORAL_TABLET | Freq: Four times a day (QID) | ORAL | Status: DC | PRN

## 2020-06-27 MED ORDER — DEXTROSE IN LACTATED RINGERS 5 % IV SOLN: INTRAVENOUS | Status: DC

## 2020-06-27 MED ORDER — SODIUM CHLORIDE 0.9 % IV BOLUS
1000.0000 mL | Freq: Once | INTRAVENOUS | Status: AC
  Administered 2020-06-27: 1000 mL via INTRAVENOUS

## 2020-06-27 MED ORDER — ACETAMINOPHEN 650 MG RE SUPP: 650.0000 mg | Freq: Four times a day (QID) | RECTAL | Status: DC | PRN

## 2020-06-27 MED ORDER — LACTATED RINGERS IV SOLN: INTRAVENOUS | Status: DC

## 2020-06-27 MED ORDER — INSULIN REGULAR(HUMAN) IN NACL 100-0.9 UT/100ML-% IV SOLN
INTRAVENOUS | Status: DC
  Administered 2020-06-27: 3.2 [IU]/h via INTRAVENOUS
  Administered 2020-06-27: 1.8 [IU]/h via INTRAVENOUS
  Administered 2020-06-27: 3.4 [IU]/h via INTRAVENOUS
  Filled 2020-06-27: qty 100

## 2020-06-27 MED ORDER — CHLORHEXIDINE GLUCONATE CLOTH 2 % EX PADS
6.0000 | MEDICATED_PAD | Freq: Every day | CUTANEOUS | Status: DC
  Administered 2020-06-27 – 2020-06-28 (×2): 6 via TOPICAL

## 2020-06-27 MED ORDER — ONDANSETRON HCL 4 MG/2ML IJ SOLN: 4.0000 mg | Freq: Four times a day (QID) | INTRAMUSCULAR | Status: DC | PRN

## 2020-06-27 MED ORDER — PRAVASTATIN SODIUM 40 MG PO TABS
40.0000 mg | ORAL_TABLET | Freq: Every day | ORAL | Status: DC
  Administered 2020-06-27 – 2020-06-29 (×3): 40 mg via ORAL
  Filled 2020-06-27 (×2): qty 2
  Filled 2020-06-27: qty 1

## 2020-06-27 MED ORDER — ORAL CARE MOUTH RINSE
15.0000 mL | Freq: Two times a day (BID) | OROMUCOSAL | Status: DC
  Administered 2020-06-27 – 2020-06-29 (×3): 15 mL via OROMUCOSAL

## 2020-06-27 NOTE — ED Triage Notes (Addendum)
Pt states she  Has had  Vag  Bleeding for a month states  On and off,  States had an ear infection and it started after that and after  Yeast infection

## 2020-06-27 NOTE — Plan of Care (Signed)
[facility] -> [facility]  Amber Patterson DOB 03-12-2055  The patient is a 65 years old AAF basically has a history of non-insulin-dependent diabetes.Family brought her in because she said she had been generally weak losing weight and then in the last month and had this recurrent vaginal bleeding postmenopausal vaginal bleeding and last few days and then lightheaded dizzy when she stands. Presented initially with soft blood pressure right around 100/70 afebrile and so ED generally working her up for that but her sugar came back at 664 and her bicarb was slightly low at 21.  EDP gave 2 Liters of fluid and ordered hyperglycemic crisis order set put her on insulin drip. Interms of  vaginal bleeding go she had a little bit of dark blood in her her vagina but no brisk or really heavy bleeding and her hemoglobin is normal. Pelvic US ordered and EDP to call GYN  She had been persistently hypotensive for the last few hours so she got more fluids and last BP was 98 over mid 60s. Per EDP her urine may have a UTI she is not symptomatic with dysuria but she has greater than 50 white cells again does not endorse chills sweats or fever and they are thinking that her hypotension might be mainly from her volume depletion related to her high sugars. Blood and Urine Cx ordered and she was given IV rocephen  Per EDP Sounds like he has probably a couple different issues including:  1.) her sugar which is in the 600s today looking back at her labs is probably chronically been poorly controlled like in the 300 but worse  2.) dysfunctional uterine bleeding: got an ultrasound her endometrium is well thickened and she might just need GYN consult   3.) question of this this UTI and pending blood and urine cultures and discuss following up on those results  She was accepted to the stepdown bed given that she remains on insulin drip and borderline hypotensive.

## 2020-06-27 NOTE — H&P (Signed)
History and Physical    MAURY BAMBA KLK:[id number] DOB: 03/23/1955 DOA: 06/27/2020  PCP: Forrest Moron, MD  Patient coming from: Home  Chief Complaint: Weakness, vaginal bleeding, high blood sugar   HPI: Amber Patterson is a 65 y.o. female with medical history significant of DM, HTN, HLD who presented to [facility] with chief complaint of generalized weakness and weight loss.  She also admitted to recurrent vaginal bleeding, postmenopausal bleeding for the past month. Denies fevers but has chills, no chest pain, nausea, vomiting, abdominal pain. She admits to exertional SOB. In the emergency department, she was found to have borderline low blood pressure 100/70, elevated blood sugar 664.  Pelvic ultrasound ordered and GYN consulted for postmenopausal bleeding.  She was also started on insulin drip for hyperglycemia, started on Rocephin for questionable UTI.   Review of Systems: As per HPI. Otherwise, all other review of systems reviewed and are negative.   Past Medical History:  Diagnosis Date  . Depression   . Diabetes mellitus without complication ([place])   . High cholesterol   . Hypertension     History reviewed. No pertinent surgical history.   reports that she has quit smoking. Her smoking use included cigarettes. She has never used smokeless tobacco. She reports that she does not drink alcohol and does not use drugs.  No Known Allergies  Family History  Problem Relation Age of Onset  . Hypertension Mother   . Diabetes Mother   . Heart attack Sister   . Cancer - Cervical Sister      Prior to Admission medications   Medication Sig Start Date End Date Taking? Authorizing Provider  aspirin EC 81 MG tablet Take 81 mg by mouth daily.   Yes [provider]  cephALEXin (KEFLEX) 500 MG capsule Take 1 capsule (500 mg total) by mouth 3 (three) times daily. 06/02/18  Yes Forrest Moron, MD  gabapentin (NEURONTIN) 100 MG capsule  10/26/19  Yes [provider]   gabapentin (NEURONTIN) 300 MG capsule Take by mouth. 12/26/19  Yes [provider]  glimepiride (AMARYL) 4 MG tablet Take 1 tablet (4 mg total) by mouth daily with breakfast. 02/20/18  Yes Renato Shin, MD  lisinopril (PRINIVIL,ZESTRIL) 20 MG tablet Take 1 tablet (20 mg total) by mouth daily. 06/02/18  Yes Forrest Moron, MD  lithium carbonate (ESKALITH) 450 MG CR tablet Take 1 tablet by mouth twice daily 05/27/18  Yes Stallings, Zoe A, MD  metFORMIN (GLUCOPHAGE) 1000 MG tablet Take 1,000 mg by mouth 2 (two) times daily. 11/28/19  Yes [provider]  pioglitazone-metformin (ACTOPLUS MET) 15-500 MG tablet Take 1 tablet by mouth 2 (two) times daily with a meal. 06/02/18  Yes Stallings, Zoe A, MD  pravastatin (PRAVACHOL) 40 MG tablet Take 1 tablet (40 mg total) by mouth daily. 06/02/18   Forrest Moron, MD  sitaGLIPtin (JANUVIA) 100 MG tablet Take 1 tablet (100 mg total) by mouth daily. 02/20/18   Renato Shin, MD  pioglitazone (ACTOS) 30 MG tablet TAKE 1 TABLET BY MOUTH ONCE DAILY 05/07/17 08/12/17  Forrest Moron, MD    Physical Exam: Vitals:   06/27/20 1430 06/27/20 1445 06/27/20 1545 06/27/20 1600  BP: 103/73 94/72 101/73 95/65  Pulse: 80 77    Resp: 14 10 (!) 29 13  Temp:      TempSrc:      SpO2: 100% 100%    Weight:      Height:  Constitutional: NAD, calm, comfortable Eyes: PERRL, lids and conjunctivae normal ENMT: Mucous membranes are moist. Normal dentition.  Respiratory: Clear to auscultation bilaterally, no wheezing, no crackles. Normal respiratory effort. No accessory muscle use. No conversational dyspnea  Cardiovascular: Regular rate and rhythm, no murmurs. No extremity edema.  Abdomen: Soft, nondistended, nontender to palpation. Bowel sounds positive.  Musculoskeletal: No joint deformity upper and lower extremities. No contractures. Normal muscle tone.  Skin: no rashes, lesions, ulcers on exposed skin  Neurologic: Alert and oriented, speech  fluent, CN 2-12 grossly intact. No focal deficits.   Psychiatric: Normal judgment and insight. Normal mood and affect   Labs on Admission: I have personally reviewed following labs and imaging studies  CBC: Recent Labs  Lab 06/27/20 1040  WBC 7.4  HGB 13.7  HCT 41.2  MCV 89.6  PLT 086   Basic Metabolic Panel: Recent Labs  Lab 06/27/20 1040  NA 125*  K 5.2*  CL 97*  CO2 21*  GLUCOSE 664*  BUN 26*  CREATININE 0.90  CALCIUM 9.6   GFR: Estimated Creatinine Clearance: 45.2 mL/min (by C-G formula based on SCr of 0.9 mg/dL). Liver Function Tests: Recent Labs  Lab 06/27/20 1040  AST 5*  ALT 6  ALKPHOS 49  BILITOT 0.6  PROT 6.9  ALBUMIN 4.1   No results for input(s): LIPASE, AMYLASE in the last 168 hours. No results for input(s): AMMONIA in the last 168 hours. Coagulation Profile: No results for input(s): INR, PROTIME in the last 168 hours. Cardiac Enzymes: No results for input(s): CKTOTAL, CKMB, CKMBINDEX, TROPONINI in the last 168 hours. BNP (last 3 results) No results for input(s): PROBNP in the last 8760 hours. HbA1C: No results for input(s): HGBA1C in the last 72 hours. CBG: Recent Labs  Lab 06/27/20 1328 06/27/20 1442 06/27/20 1626  GLUCAP 391* 285* 261*   Lipid Profile: No results for input(s): CHOL, HDL, LDLCALC, TRIG, CHOLHDL, LDLDIRECT in the last 72 hours. Thyroid Function Tests: No results for input(s): TSH, T4TOTAL, FREET4, T3FREE, THYROIDAB in the last 72 hours. Anemia Panel: No results for input(s): VITAMINB12, FOLATE, FERRITIN, TIBC, IRON, RETICCTPCT in the last 72 hours. Urine analysis:    Component Value Date/Time   COLORURINE YELLOW 06/27/2020 1412   APPEARANCEUR HAZY (A) 06/27/2020 1412   LABSPEC 1.026 06/27/2020 1412   PHURINE 5.5 06/27/2020 1412   GLUCOSEU >1,000 (A) 06/27/2020 1412   HGBUR SMALL (A) 06/27/2020 1412   BILIRUBINUR NEGATIVE 06/27/2020 1412   BILIRUBINUR negative 08/12/2017 1041   KETONESUR NEGATIVE 06/27/2020 1412    PROTEINUR NEGATIVE 06/27/2020 1412   UROBILINOGEN 0.2 08/12/2017 1041   UROBILINOGEN 0.2 04/23/2013 0900   NITRITE NEGATIVE 06/27/2020 1412   LEUKOCYTESUR SMALL (A) 06/27/2020 1412   Sepsis Labs: !!!!!!!!!!!!!!!!!!!!!!!!!!!!!!!!!!!!!!!!!!!! @LABRCNTIP (procalcitonin:4,lacticidven:4) ) Recent Results (from the past 240 hour(s))  Resp Panel by RT-PCR (Flu A&B, Covid) Nasopharyngeal Swab     Status: None   Collection Time: 06/27/20  2:59 PM   Specimen: Nasopharyngeal Swab; Nasopharyngeal(NP) swabs in vial transport medium  Result Value Ref Range Status   SARS Coronavirus 2 by RT PCR NEGATIVE NEGATIVE Final    Comment: (NOTE) SARS-CoV-2 target nucleic acids are NOT DETECTED.  The SARS-CoV-2 RNA is generally detectable in upper respiratory specimens during the acute phase of infection. The lowest concentration of SARS-CoV-2 viral copies this assay can detect is 138 copies/mL. A negative result does not preclude SARS-Cov-2 infection and should not be used as the sole basis for treatment or other patient management decisions. A negative  result may occur with  improper specimen collection/handling, submission of specimen other than nasopharyngeal swab, presence of viral mutation(s) within the areas targeted by this assay, and inadequate number of viral copies(<138 copies/mL). A negative result must be combined with clinical observations, patient history, and epidemiological information. The expected result is Negative.  Fact Sheet for Patients:  EntrepreneurPulse.com.au  Fact Sheet for Healthcare Providers:  IncredibleEmployment.be  This test is no t yet approved or cleared by the [place] FDA and  has been authorized for detection and/or diagnosis of SARS-CoV-2 by FDA under an Emergency Use Authorization (EUA). This EUA will remain  in effect (meaning this test can be used) for the duration of the COVID-19 declaration under Section 564(b)(1)  of the Act, 21 U.S.C.section 360bbb-3(b)(1), unless the authorization is terminated  or revoked sooner.       Influenza A by PCR NEGATIVE NEGATIVE Final   Influenza B by PCR NEGATIVE NEGATIVE Final    Comment: (NOTE) The Xpert Xpress SARS-CoV-2/FLU/RSV plus assay is intended as an aid in the diagnosis of influenza from Nasopharyngeal swab specimens and should not be used as a sole basis for treatment. Nasal washings and aspirates are unacceptable for Xpert Xpress SARS-CoV-2/FLU/RSV testing.  Fact Sheet for Patients: EntrepreneurPulse.com.au  Fact Sheet for Healthcare Providers: IncredibleEmployment.be  This test is not yet approved or cleared by the [place] FDA and has been authorized for detection and/or diagnosis of SARS-CoV-2 by FDA under an Emergency Use Authorization (EUA). This EUA will remain in effect (meaning this test can be used) for the duration of the COVID-19 declaration under Section 564(b)(1) of the Act, 21 U.S.C. section 360bbb-3(b)(1), unless the authorization is terminated or revoked.  Performed at [facility]      Radiological Exams on Admission: US PELVIC COMPLETE WITH TRANSVAGINAL  Result Date: 06/27/2020 CLINICAL DATA:  Vaginal bleeding. EXAM: TRANSABDOMINAL AND TRANSVAGINAL ULTRASOUND OF PELVIS TECHNIQUE: Both transabdominal and transvaginal ultrasound examinations of the pelvis were performed. Transabdominal technique was performed for global imaging of the pelvis including uterus, ovaries, adnexal regions, and pelvic cul-de-sac. It was necessary to proceed with endovaginal exam following the transabdominal exam to visualize the endometrium. COMPARISON:  None FINDINGS: Uterus Measurements: 9.2 x 7.7 x 7.4 cm = volume: 227 mL. At least 3 uterine fibroids are noted. The largest measures 4 cm in the posterior uterus. Endometrium Thickness: 11 mm which is abnormally thickened for postmenopausal  patient. No focal abnormality visualized. Right ovary Not visualized. Left ovary Measurements: 2.1 x 1.4 x 1.1 cm = volume: 2 mL. Normal appearance/no adnexal mass. Other findings No abnormal free fluid. IMPRESSION: Endometrium is abnormally thickened for postmenopausal patient. In the setting of post-menopausal bleeding, endometrial sampling is indicated to exclude carcinoma. If results are benign, sonohysterogram should be considered for focal lesion work-up. (Ref: Radiological Reasoning: Algorithmic Workup of Abnormal Vaginal Bleeding with Endovaginal Sonography and Sonohysterography. AJR 2008; [id number]). Several uterine fibroids are noted, the largest measuring 4 mm. Left ovary is not visualized. Electronically Signed   By: Marijo Conception M.D.   On: 06/27/2020 14:29    Assessment/Plan Principal Problem:   Hyperosmolar hyperglycemic state (HHS) ([place]) Active Problems:   HTN (hypertension)   HLD (hyperlipidemia)   DM (diabetes mellitus), type 2 (HCC)   Bipolar disease, chronic (HCC)   Hypotension   Dysfunctional uterine bleeding   HHS -Continue insulin drip -Check hemoglobin A1c (hemoglobin A1c in chart 04/14/2018 was 10.3) -Diabetic coordinator consult  Hypertension -Hold lisinopril in setting of hypotension  Hyperlipidemia -Continue pravastatin  Dysfunctional uterine bleeding -Pelvic ultrasound revealed thickened endometrium, several uterine fibroid -GYN consulted  Hyponatremia -Corrected sodium and hyperglycemic patient is 139  Hyperkalemia -Receiving insulin drip, continue to monitor  Pyuria -Urine culture pending -Empiric rocephin   Bipolar -Continue lithium    DVT prophylaxis: SCD   Code Status: Full  Family Communication: Daughter at bedside  Disposition Plan: Return back home on discharge Consults called: GYN (called by EDP)    Status is: Inpatient  Remains inpatient appropriate because:Hemodynamically unstable, Persistent severe electrolyte  disturbances, IV treatments appropriate due to intensity of illness or inability to take PO and Inpatient level of care appropriate due to severity of illness   Dispo: The patient is from: Home              Anticipated d/c is to: Home              Patient currently is not medically stable to d/c.   Difficult to place patient No       Severity of Illness: The appropriate patient status for this patient is INPATIENT. Inpatient status is judged to be reasonable and necessary in order to provide the required intensity of service to ensure the patient's safety. The patient's presenting symptoms, physical exam findings, and initial radiographic and laboratory data in the context of their chronic comorbidities is felt to place them at high risk for further clinical deterioration. Furthermore, it is not anticipated that the patient will be medically stable for discharge from the hospital within 2 midnights of admission.   * I certify that at the point of admission it is my clinical judgment that the patient will require inpatient hospital care spanning beyond 2 midnights from the point of admission due to high intensity of service, high risk for further deterioration and high frequency of surveillance required.Dessa Phi, DO [facility] 06/27/2020, 5:10 PM   Available via Epic secure chat 7am-7pm After these hours, please refer to coverage provider listed on [email or web address]

## 2020-06-27 NOTE — Plan of Care (Signed)
  Problem: Education: Goal: Ability to describe self-care measures that may prevent or decrease complications (Diabetes Survival Skills Education) will improve Outcome: Progressing   Problem: Coping: Goal: Ability to adjust to condition or change in health will improve Outcome: Progressing   Problem: Education: Goal: Knowledge of General Education information will improve Description: Including pain rating scale, medication(s)/side effects and non-pharmacologic comfort measures Outcome: Progressing   Problem: Coping: Goal: Level of anxiety will decrease Outcome: Progressing   Problem: Pain Managment: Goal: General experience of comfort will improve Outcome: Progressing   

## 2020-06-27 NOTE — ED Notes (Signed)
Pt to [facility] via [facility]

## 2020-06-27 NOTE — ED Provider Notes (Addendum)
[facility] DEPT Provider Note   CSN: [id number] Arrival date & time: 06/27/20  4742     History Chief Complaint  Patient presents with  . Vaginal Bleeding    Amber Patterson is a 65 y.o. female.  Patient c/o vaginal bleeding intermittently x 1 month. States has been post-menopausal for years, with no abnormal vaginal bleeding until this past month. States is lighter than prior normal period. Denies vaginal or pelvic pain. Normal appetite. +unspecific wt loss. Remote hx anemia. Denies other abnormal bruising or bleeding. No anticoag use. Denies hx ovarian mass/cysts. Denies hx fibroids. Denies hematuria or rectal bleeding. No abd/pelvic pain. No back/flank pain. No fever or chills.  Pt notes lightheadedness when stands. Family also concerned w recent wt loss, and generalized weakness.   The history is provided by the patient.  Vaginal Bleeding Associated symptoms: no abdominal pain, no back pain, no dysuria and no fever        Past Medical History:  Diagnosis Date  . Depression   . Diabetes mellitus without complication ([place])   . High cholesterol   . Hypertension     Patient Active Problem List   Diagnosis Date Noted  . Osteoarthritis 11/19/2016  . Chest pain 04/24/2016  . HTN (hypertension) 04/24/2016  . HLD (hyperlipidemia) 04/24/2016  . DM (diabetes mellitus), type 2 ([place]) 04/24/2016  . Bipolar disease, chronic ([place]) 04/24/2016    History reviewed. No pertinent surgical history.   OB History   No obstetric history on file.     Family History  Problem Relation Age of Onset  . Hypertension Mother   . Diabetes Mother   . Heart attack Sister   . Cancer - Cervical Sister     Social History   Tobacco Use  . Smoking status: Former Smoker    Types: Cigarettes  . Smokeless tobacco: Never Used  Substance Use Topics  . Alcohol use: No  . Drug use: No    Frequency: 3.0 times per week    Home Medications Prior to Admission  medications   Medication Sig Start Date End Date Taking? Authorizing Provider  aspirin EC 81 MG tablet Take 81 mg by mouth daily.   Yes [provider]  cephALEXin (KEFLEX) 500 MG capsule Take 1 capsule (500 mg total) by mouth 3 (three) times daily. 06/02/18  Yes Forrest Moron, MD  gabapentin (NEURONTIN) 100 MG capsule  10/26/19  Yes [provider]  gabapentin (NEURONTIN) 300 MG capsule Take by mouth. 12/26/19  Yes [provider]  glimepiride (AMARYL) 4 MG tablet Take 1 tablet (4 mg total) by mouth daily with breakfast. 02/20/18  Yes Renato Shin, MD  lisinopril (PRINIVIL,ZESTRIL) 20 MG tablet Take 1 tablet (20 mg total) by mouth daily. 06/02/18  Yes Forrest Moron, MD  lithium carbonate (ESKALITH) 450 MG CR tablet Take 1 tablet by mouth twice daily 05/27/18  Yes Stallings, Zoe A, MD  metFORMIN (GLUCOPHAGE) 1000 MG tablet Take 1,000 mg by mouth 2 (two) times daily. 11/28/19  Yes [provider]  pioglitazone-metformin (ACTOPLUS MET) 15-500 MG tablet Take 1 tablet by mouth 2 (two) times daily with a meal. 06/02/18  Yes Stallings, Zoe A, MD  pravastatin (PRAVACHOL) 40 MG tablet Take 1 tablet (40 mg total) by mouth daily. 06/02/18   Forrest Moron, MD  sitaGLIPtin (JANUVIA) 100 MG tablet Take 1 tablet (100 mg total) by mouth daily. 02/20/18   Renato Shin, MD  pioglitazone (ACTOS) 30 MG tablet TAKE 1  TABLET BY MOUTH ONCE DAILY 05/07/17 08/12/17  Forrest Moron, MD    Allergies    Patient has no known allergies.  Review of Systems   Review of Systems  Constitutional: Negative for chills and fever.  HENT: Negative for nosebleeds.   Eyes: Negative for redness.  Respiratory: Negative for cough and shortness of breath.   Cardiovascular: Negative for chest pain.  Gastrointestinal: Negative for abdominal pain, blood in stool, diarrhea and vomiting.  Endocrine:       Glucose v high  Genitourinary: Positive for vaginal bleeding. Negative for dysuria, flank pain  and hematuria.  Musculoskeletal: Negative for back pain.  Skin: Negative for rash.  Neurological: Positive for weakness and light-headedness. Negative for syncope.  Hematological: Does not bruise/bleed easily.  Psychiatric/Behavioral: Negative for confusion.    Physical Exam Updated Vital Signs BP 129/82 (BP Location: Right Arm)   Pulse 82   Temp 98.6 F (37 C) (Oral)   Resp 16   Ht 1.549 m (5' 1" )   Wt 45.9 kg   SpO2 100%   BMI 19.10 kg/m   Physical Exam Vitals and nursing note reviewed.  Constitutional:      Appearance: Normal appearance. She is well-developed.  HENT:     Head: Atraumatic.     Nose: Nose normal.     Mouth/Throat:     Mouth: Mucous membranes are moist.  Eyes:     General: No scleral icterus.    Conjunctiva/sclera: Conjunctivae normal.  Neck:     Trachea: No tracheal deviation.  Cardiovascular:     Rate and Rhythm: Normal rate and regular rhythm.     Pulses: Normal pulses.     Heart sounds: Normal heart sounds. No murmur heard. No friction rub. No gallop.   Pulmonary:     Effort: Pulmonary effort is normal. No respiratory distress.     Breath sounds: Normal breath sounds.  Abdominal:     General: Bowel sounds are normal. There is no distension.     Palpations: Abdomen is soft. There is no mass.     Tenderness: There is no abdominal tenderness. There is no guarding.  Genitourinary:    Comments: No cva tenderness.  Musculoskeletal:        General: No swelling.     Cervical back: Normal range of motion and neck supple. No rigidity. No muscular tenderness.  Skin:    General: Skin is warm and dry.     Findings: No rash.  Neurological:     Mental Status: She is alert.     Comments: Alert, speech normal.   Psychiatric:        Mood and Affect: Mood normal.     ED Results / Procedures / Treatments   Labs (all labs ordered are listed, but only abnormal results are displayed) Results for orders placed or performed during the hospital encounter  of 06/27/20  CBC  Result Value Ref Range   WBC 7.4 4.0 - 10.5 K/uL   RBC 4.60 3.87 - 5.11 MIL/uL   Hemoglobin 13.7 12.0 - 15.0 g/dL   HCT 41.2 36.0 - 46.0 %   MCV 89.6 80.0 - 100.0 fL   MCH 29.8 26.0 - 34.0 pg   MCHC 33.3 30.0 - 36.0 g/dL   RDW 13.0 11.5 - 15.5 %   Platelets 301 150 - 400 K/uL   nRBC 0.0 0.0 - 0.2 %  Comprehensive metabolic panel  Result Value Ref Range   Sodium 125 (L) 135 - 145 mmol/L  Potassium 5.2 (H) 3.5 - 5.1 mmol/L   Chloride 97 (L) 98 - 111 mmol/L   CO2 21 (L) 22 - 32 mmol/L   Glucose, Bld 664 (HH) 70 - 99 mg/dL   BUN 26 (H) 8 - 23 mg/dL   Creatinine, Ser 0.90 0.44 - 1.00 mg/dL   Calcium 9.6 8.9 - 10.3 mg/dL   Total Protein 6.9 6.5 - 8.1 g/dL   Albumin 4.1 3.5 - 5.0 g/dL   AST 5 (L) 15 - 41 U/L   ALT 6 0 - 44 U/L   Alkaline Phosphatase 49 38 - 126 U/L   Total Bilirubin 0.6 0.3 - 1.2 mg/dL   GFR, Estimated >60 >60 mL/min   Anion gap 7 5 - 15  Urinalysis, Routine w reflex microscopic  Result Value Ref Range   Color, Urine YELLOW YELLOW   APPearance HAZY (A) CLEAR   Specific Gravity, Urine 1.026 1.005 - 1.030   pH 5.5 5.0 - 8.0   Glucose, UA >1,000 (A) NEGATIVE mg/dL   Hgb urine dipstick SMALL (A) NEGATIVE   Bilirubin Urine NEGATIVE NEGATIVE   Ketones, ur NEGATIVE NEGATIVE mg/dL   Protein, ur NEGATIVE NEGATIVE mg/dL   Nitrite NEGATIVE NEGATIVE   Leukocytes,Ua SMALL (A) NEGATIVE   RBC / HPF 0-5 0 - 5 RBC/hpf   WBC, UA >50 (H) 0 - 5 WBC/hpf   Bacteria, UA RARE (A) NONE SEEN  CBG monitoring, ED  Result Value Ref Range   Glucose-Capillary 391 (H) 70 - 99 mg/dL    EKG None  Radiology US PELVIC COMPLETE WITH TRANSVAGINAL  Result Date: 06/27/2020 CLINICAL DATA:  Vaginal bleeding. EXAM: TRANSABDOMINAL AND TRANSVAGINAL ULTRASOUND OF PELVIS TECHNIQUE: Both transabdominal and transvaginal ultrasound examinations of the pelvis were performed. Transabdominal technique was performed for global imaging of the pelvis including uterus, ovaries, adnexal  regions, and pelvic cul-de-sac. It was necessary to proceed with endovaginal exam following the transabdominal exam to visualize the endometrium. COMPARISON:  None FINDINGS: Uterus Measurements: 9.2 x 7.7 x 7.4 cm = volume: 227 mL. At least 3 uterine fibroids are noted. The largest measures 4 cm in the posterior uterus. Endometrium Thickness: 11 mm which is abnormally thickened for postmenopausal patient. No focal abnormality visualized. Right ovary Not visualized. Left ovary Measurements: 2.1 x 1.4 x 1.1 cm = volume: 2 mL. Normal appearance/no adnexal mass. Other findings No abnormal free fluid. IMPRESSION: Endometrium is abnormally thickened for postmenopausal patient. In the setting of post-menopausal bleeding, endometrial sampling is indicated to exclude carcinoma. If results are benign, sonohysterogram should be considered for focal lesion work-up. (Ref: Radiological Reasoning: Algorithmic Workup of Abnormal Vaginal Bleeding with Endovaginal Sonography and Sonohysterography. AJR 2008; 416:S06-30). Several uterine fibroids are noted, the largest measuring 4 mm. Left ovary is not visualized. Electronically Signed   By: Marijo Conception M.D.   On: 06/27/2020 14:29    Procedures Procedures   Medications Ordered in ED Medications - No data to display  ED Course  I have reviewed the triage vital signs and the nursing notes.  Pertinent labs & imaging results that were available during my care of the patient were reviewed by me and considered in my medical decision making (see chart for details).    MDM Rules/Calculators/A&P                          Labs sent. Pelvic exam chaperoned by female staff.   Reviewed nursing notes and prior charts for additional history.  Labs reviewed/interpreted by me - hgb normal. Wbc normal.   Additional labs reviewed/interpreted by me - glucose severely high, 664. hc03 sl low at 21. Insulin gtt/ivf via hyperglycemic crisis order set.   U/s ordered.   Recheck,  bp is low/soft. Pt afebrile. No chills/sweats. ?whether possibly due to volume depletion related to severe hyperglycemia (and possible additional contribution from recent vaginal bleeding/blood loss, although hgb is okay).  Additional ns bolus.   U/s reviewed/interpreted by me - thickened endometrium.   Additional labs reviewed/interpreted by me - ua positive. Although afebrile, given low bp, will culture and tx, and add lactate and cultures.   Rocephin iv. hospitalists consulted for admission.   CRITICAL CARE RE: hypotension, abnormal post-menopausal vaginal bleeding, severe hyperglycemia, volume depletion, wt loss, UTI.  Performed by: Mirna Mires Total critical care time: 45 minutes Critical care time was exclusive of separately billable procedures and treating other patients. Critical care was necessary to treat or prevent imminent or life-threatening deterioration. Critical care was time spent personally by me on the following activities: development of treatment plan with patient and/or surrogate as well as nursing, discussions with consultants, evaluation of patient's response to treatment, examination of patient, obtaining history from patient or surrogate, ordering and performing treatments and interventions, ordering and review of laboratory studies, ordering and review of radiographic studies, pulse oximetry and re-evaluation of patient's condition.  Discussed pt with hospitalist who accepts in admission to [facility], stepdown - he also requests cxr and gyn consult - cxr ordered, gyn consulted.   Did not receive call back from gyn. Ob/GYN re-consulted - still awaiting call back at time of transport to [facility] (admitting team will need to place consult once arrives).     Final Clinical Impression(s) / ED Diagnoses Final diagnoses:  None    Rx / DC Orders ED Discharge Orders    None          Lajean Saver, MD 06/27/20 1752

## 2020-06-28 DIAGNOSIS — E1100 Type 2 diabetes mellitus with hyperosmolarity without nonketotic hyperglycemic-hyperosmolar coma (NKHHC): Secondary | ICD-10-CM | POA: Diagnosis not present

## 2020-06-28 DIAGNOSIS — E1165 Type 2 diabetes mellitus with hyperglycemia: Secondary | ICD-10-CM | POA: Diagnosis not present

## 2020-06-28 LAB — BASIC METABOLIC PANEL
Anion gap: 6 (ref 5–15)
Anion gap: 4 — ABNORMAL LOW (ref 5–15)
Anion gap: 3 — ABNORMAL LOW (ref 5–15)
BUN: 21 mg/dL (ref 8–23)
BUN: 17 mg/dL (ref 8–23)
BUN: 17 mg/dL (ref 8–23)
CO2: 18 mmol/L — ABNORMAL LOW (ref 22–32)
CO2: 21 mmol/L — ABNORMAL LOW (ref 22–32)
CO2: 25 mmol/L (ref 22–32)
Calcium: 8 mg/dL — ABNORMAL LOW (ref 8.9–10.3)
Calcium: 8.5 mg/dL — ABNORMAL LOW (ref 8.9–10.3)
Calcium: 8.9 mg/dL (ref 8.9–10.3)
Chloride: 104 mmol/L (ref 98–111)
Chloride: 107 mmol/L (ref 98–111)
Chloride: 108 mmol/L (ref 98–111)
Creatinine, Ser: 0.78 mg/dL (ref 0.44–1.00)
Creatinine, Ser: 0.61 mg/dL (ref 0.44–1.00)
Creatinine, Ser: 0.58 mg/dL (ref 0.44–1.00)
GFR, Estimated: >60 mL/min (ref >60)
GFR, Estimated: >60 mL/min (ref >60)
GFR, Estimated: >60 mL/min (ref >60)
Glucose, Bld: 190 mg/dL — ABNORMAL HIGH (ref 70–99)
Glucose, Bld: 156 mg/dL — ABNORMAL HIGH (ref 70–99)
Glucose, Bld: 148 mg/dL — ABNORMAL HIGH (ref 70–99)
Potassium: 4.4 mmol/L (ref 3.5–5.1)
Potassium: 4.2 mmol/L (ref 3.5–5.1)
Potassium: 4.5 mmol/L (ref 3.5–5.1)
Sodium: 128 mmol/L — ABNORMAL LOW (ref 135–145)
Sodium: 132 mmol/L — ABNORMAL LOW (ref 135–145)
Sodium: 136 mmol/L (ref 135–145)

## 2020-06-28 LAB — GLUCOSE, CAPILLARY
Glucose-Capillary: 119 mg/dL — ABNORMAL HIGH (ref 70–99)
Glucose-Capillary: 145 mg/dL — ABNORMAL HIGH (ref 70–99)
Glucose-Capillary: 161 mg/dL — ABNORMAL HIGH (ref 70–99)
Glucose-Capillary: 178 mg/dL — ABNORMAL HIGH (ref 70–99)
Glucose-Capillary: 181 mg/dL — ABNORMAL HIGH (ref 70–99)
Glucose-Capillary: 187 mg/dL — ABNORMAL HIGH (ref 70–99)
Glucose-Capillary: 191 mg/dL — ABNORMAL HIGH (ref 70–99)
Glucose-Capillary: 259 mg/dL — ABNORMAL HIGH (ref 70–99)
Glucose-Capillary: 368 mg/dL — ABNORMAL HIGH (ref 70–99)

## 2020-06-28 LAB — CBC
HCT: 36.9 % (ref 36.0–46.0)
Hemoglobin: 11.7 g/dL — ABNORMAL LOW (ref 12.0–15.0)
MCH: 29.5 pg (ref 26.0–34.0)
MCHC: 31.7 g/dL (ref 30.0–36.0)
MCV: 92.9 fL (ref 80.0–100.0)
Platelets: 247 10*3/uL (ref 150–400)
RBC: 3.97 MIL/uL (ref 3.87–5.11)
RDW: 13 % (ref 11.5–15.5)
WBC: 7.5 10*3/uL (ref 4.0–10.5)
nRBC: 0 % (ref 0.0–0.2)

## 2020-06-28 MED ORDER — INSULIN ASPART 100 UNIT/ML ~~LOC~~ SOLN
0.0000 [IU] | Freq: Every day | SUBCUTANEOUS | Status: DC
  Administered 2020-06-28: 5 [IU] via SUBCUTANEOUS

## 2020-06-28 MED ORDER — INSULIN GLARGINE 100 UNIT/ML ~~LOC~~ SOLN
10.0000 [IU] | Freq: Every day | SUBCUTANEOUS | Status: DC
  Administered 2020-06-28 – 2020-06-29 (×2): 10 [IU] via SUBCUTANEOUS
  Filled 2020-06-28 (×3): qty 0.1

## 2020-06-28 MED ORDER — INSULIN ASPART 100 UNIT/ML ~~LOC~~ SOLN
0.0000 [IU] | Freq: Three times a day (TID) | SUBCUTANEOUS | Status: DC
  Administered 2020-06-28 – 2020-06-29 (×4): 5 [IU] via SUBCUTANEOUS

## 2020-06-28 MED ORDER — GLUCERNA SHAKE PO LIQD
237.0000 mL | Freq: Three times a day (TID) | ORAL | Status: DC
  Administered 2020-06-28 – 2020-06-29 (×3): 237 mL via ORAL
  Filled 2020-06-28 (×6): qty 237

## 2020-06-28 NOTE — TOC Initial Note (Signed)
Transition of Care [facility]) - Initial/Assessment Note    Patient Details  Name: Amber Patterson MRN: [id number] Date of Birth: [birth year]  Transition of Care [facility]) CM/SW Contact:    Golda Acre, RN Phone Number: 06/28/2020, 9:15 AM  Clinical Narrative:                  65 y.o. female with medical history significant of DM, HTN, HLD who presented to [facility] with chief complaint of generalized weakness and weight loss.  She also admitted to recurrent vaginal bleeding, postmenopausal bleeding for the past month. Denies fevers but has chills, no chest pain, nausea, vomiting, abdominal pain. She admits to exertional SOB. In the emergency department, she was found to have borderline low blood pressure 100/70, elevated blood sugar 664.  Pelvic ultrasound ordered and GYN consulted for postmenopausal bleeding.  She was also started on insulin drip for hyperglycemia, started on Rocephin for questionable UTI.  PLAN: to return to home after stable and diabetic coordinator has seen. Expected Discharge Plan: Home/Self Care Barriers to Discharge: Continued Medical Work up   Patient Goals and CMS Choice Patient states their goals for this hospitalization and ongoing recovery are:: to go home      Expected Discharge Plan and Services Expected Discharge Plan: Home/Self Care   Discharge Planning Services: CM Consult   Living arrangements for the past 2 months: Apartment                                      Prior Living Arrangements/Services Living arrangements for the past 2 months: Apartment Lives with:: Spouse Patient language and need for interpreter reviewed:: Yes Do you feel safe going back to the place where you live?: Yes      Need for Family Participation in Patient Care: No (Comment) Care giver support system in place?: No (comment)   Criminal Activity/Legal Involvement Pertinent to Current Situation/Hospitalization: No - Comment as needed  Activities of Daily Living Home  Assistive Devices/Equipment: Eyeglasses,Dentures (specify type) (full upper plate and lower partial) ADL Screening (condition at time of admission) Patient's cognitive ability adequate to safely complete daily activities?: Yes Is the patient deaf or have difficulty hearing?: No Does the patient have difficulty seeing, even when wearing glasses/contacts?: Yes (gets blurry but pt has allergies) Does the patient have difficulty concentrating, remembering, or making decisions?: Yes (at times) Patient able to express need for assistance with ADLs?: Yes Does the patient have difficulty dressing or bathing?: No Independently performs ADLs?: Yes (appropriate for developmental age) Does the patient have difficulty walking or climbing stairs?: No Weakness of Legs: Both Weakness of Arms/Hands: Both  Permission Sought/Granted                  Emotional Assessment Appearance:: Appears stated age Attitude/Demeanor/Rapport: Engaged Affect (typically observed): Calm Orientation: : Oriented to Self,Oriented to Place,Oriented to  Time,Oriented to Situation Alcohol / Substance Use: Not Applicable Psych Involvement: No (comment)  Admission diagnosis:  Dehydration [E86.0] Dysfunctional uterine bleeding [N93.8] Vaginal bleeding [N93.9] Weight loss [R63.4] Hyperglycemia [R73.9] Acute UTI [N39.0] Hypotension [I95.9] Hypotension due to hypovolemia [I95.89, E86.1] Patient Active Problem List   Diagnosis Date Noted  . Hypotension 06/27/2020  . Hyperosmolar hyperglycemic state (HHS) ([facility]) 06/27/2020  . Dysfunctional uterine bleeding 06/27/2020  . Osteoarthritis 11/19/2016  . HTN (hypertension) 04/24/2016  . HLD (hyperlipidemia) 04/24/2016  . DM (diabetes mellitus), type 2 ([facility]) 04/24/2016  .  Bipolar disease, chronic ([facility]) 04/24/2016   PCP:  Doristine Bosworth, MD Pharmacy:   [facility] [address] ([place]), [place] - [address] [place] ([place]) [place]  [address] Phone: [phone] Fax: [phone]  [id number] - [place], [place] - [address] [place] [address] Phone: [phone] Fax: [phone]     Social Determinants of Health (SDOH) Interventions    Readmission Risk Interventions No flowsheet data found.

## 2020-06-28 NOTE — Progress Notes (Signed)
PROGRESS NOTE    LENYA STERNE  LFY:[id number] DOB: 02-Jan-1956 DOA: 06/27/2020 PCP: Doristine Bosworth, MD     Brief Narrative:  Amber Patterson is a 65 y.o. female with medical history significant of DM, HTN, HLD who presented to [facility] with chief complaint of generalized weakness and weight loss.  She also admitted to recurrent vaginal bleeding, postmenopausal bleeding for the past month. Denies fevers but has chills, no chest pain, nausea, vomiting, abdominal pain. She admits to exertional SOB. In the emergency department, she was found to have borderline low blood pressure 100/70, elevated blood sugar 664.  Pelvic ultrasound ordered and GYN consulted for postmenopausal bleeding.  She was also started on insulin drip for hyperglycemia, started on Rocephin for questionable UTI.   New events last 24 hours / Subjective: Patient feeling well this morning.  Blood sugars improved overnight and has been weaned off of insulin drip.  She is agreeable to checking her blood sugars more regularly at home.  Assessment & Plan:   Principal Problem:   Hyperosmolar hyperglycemic state (HHS) (HCC) Active Problems:   HTN (hypertension)   HLD (hyperlipidemia)   DM (diabetes mellitus), type 2 (HCC)   Bipolar disease, chronic (HCC)   Hypotension   Dysfunctional uterine bleeding   HHS -Hemoglobin A1c 13.2 -Diabetic coordinator consulted -Recommended insulin on discharge, patient was resistant to this idea -Transition off insulin drip, continue Lantus, NovoLog  Hypertension -Hold lisinopril in setting of hypotension  Hyperlipidemia -Continue pravastatin  Dysfunctional uterine bleeding -Pelvic ultrasound revealed thickened endometrium, several uterine fibroid -Discussed with GYN on-call, they recommend outpatient follow-up.  They will provide with follow-up information in AVS  Hyponatremia -Due to hyperglycemia.  Resolved  Hyperkalemia -Resolved  Pyuria -No urinary symptoms -Urine  culture pending -Empiric rocephin   Bipolar -Continue lithium     DVT prophylaxis:  SCDs Start: 06/27/20 1739  Code Status:     Code Status Orders  (From admission, onward)         Start     Ordered   06/27/20 1739  Full code  Continuous        06/27/20 1740        Code Status History    Date Active Date Inactive Code Status Order ID Comments User Context   04/24/2016 1842 04/25/2016 2228 Full Code [id number]  Jordan Hawks, DO Inpatient   Advance Care Planning Activity    Advance Directive Documentation   Flowsheet Row Most Recent Value  Type of Advance Directive Healthcare Power of Attorney  Pre-existing out of facility DNR order (yellow form or pink MOST form) --  "MOST" Form in Place? --     Family Communication: None at bedside Disposition Plan:  Status is: Inpatient  Remains inpatient appropriate because:IV treatments appropriate due to intensity of illness or inability to take PO   Dispo: The patient is from: Home              Anticipated d/c is to: Home              Patient currently is not medically stable to d/c.  Pending diabetic coordinator and insulin education, urine culture result, remains on IV Rocephin.  Transfer to progressive unit.   Difficult to place patient No   Antimicrobials:  Anti-infectives (From admission, onward)   Start     Dose/Rate Route Frequency Ordered Stop   06/28/20 1700  cefTRIAXone (ROCEPHIN) 1 g in sodium chloride 0.9 % 100 mL IVPB  1 g 200 mL/hr over 30 Minutes Intravenous Every 24 hours 06/27/20 1740     06/27/20 1445  cefTRIAXone (ROCEPHIN) 1 g in sodium chloride 0.9 % 100 mL IVPB        1 g 200 mL/hr over 30 Minutes Intravenous  Once 06/27/20 1439 06/27/20 1633        Objective: Vitals:   06/28/20 0400 06/28/20 0500 06/28/20 0800 06/28/20 1000  BP: (!) 94/59 107/64    Pulse: 72 72 74 (!) 103  Resp: 14 16 11 16   Temp: 98.6 F (37 C)     TempSrc: Oral     SpO2: 100% 100% 100% 100%   Weight:      Height:        Intake/Output Summary (Last 24 hours) at 06/28/2020 1219 Last data filed at 06/28/2020 0955 Gross per 24 hour  Intake 2526.21 ml  Output 900 ml  Net 1626.21 ml   Filed Weights   06/27/20 0952  Weight: 45.9 kg    Examination:  General exam: Appears calm and comfortable  Respiratory system: Clear to auscultation. Respiratory effort normal. No respiratory distress. No conversational dyspnea.  Cardiovascular system: S1 & S2 heard, RRR. No murmurs. No pedal edema. Gastrointestinal system: Abdomen is nondistended, soft and nontender. Normal bowel sounds heard. Central nervous system: Alert and oriented. No focal neurological deficits. Speech clear.  Extremities: Symmetric in appearance  Skin: No rashes, lesions or ulcers on exposed skin  Psychiatry: Judgement and insight appear normal. Mood & affect appropriate.   Data Reviewed: I have personally reviewed following labs and imaging studies  CBC: Recent Labs  Lab 06/27/20 1040 06/28/20 0139  WBC 7.4 7.5  HGB 13.7 11.7*  HCT 41.2 36.9  MCV 89.6 92.9  PLT 301 247   Basic Metabolic Panel: Recent Labs  Lab 06/27/20 1752 06/27/20 2136 06/28/20 0139 06/28/20 0544 06/28/20 0921  NA 135 130* 128* 132* 136  K 4.7 4.8 4.4 4.2 4.5  CL 108 107 104 107 108  CO2 20* 17* 18* 21* 25  GLUCOSE 306* 226* 190* 156* 148*  BUN 22 22 21 17 17   CREATININE 1.01* 0.91 0.78 0.61 0.58  CALCIUM 8.6* 8.3* 8.0* 8.5* 8.9   GFR: Estimated Creatinine Clearance: 50.8 mL/min (by C-G formula based on SCr of 0.58 mg/dL). Liver Function Tests: Recent Labs  Lab 06/27/20 1040  AST 5*  ALT 6  ALKPHOS 49  BILITOT 0.6  PROT 6.9  ALBUMIN 4.1   No results for input(s): LIPASE, AMYLASE in the last 168 hours. No results for input(s): AMMONIA in the last 168 hours. Coagulation Profile: No results for input(s): INR, PROTIME in the last 168 hours. Cardiac Enzymes: No results for input(s): CKTOTAL, CKMB, CKMBINDEX,  TROPONINI in the last 168 hours. BNP (last 3 results) No results for input(s): PROBNP in the last 8760 hours. HbA1C: Recent Labs    06/27/20 1752  HGBA1C 13.2*   CBG: Recent Labs  Lab 06/28/20 0353 06/28/20 0454 06/28/20 0552 06/28/20 0747 06/28/20 1137  GLUCAP 181* 161* 145* 119* 259*   Lipid Profile: No results for input(s): CHOL, HDL, LDLCALC, TRIG, CHOLHDL, LDLDIRECT in the last 72 hours. Thyroid Function Tests: No results for input(s): TSH, T4TOTAL, FREET4, T3FREE, THYROIDAB in the last 72 hours. Anemia Panel: No results for input(s): VITAMINB12, FOLATE, FERRITIN, TIBC, IRON, RETICCTPCT in the last 72 hours. Sepsis Labs: Recent Labs  Lab 06/27/20 1459  LATICACIDVEN 2.9*    Recent Results (from the past 240 hour(s))  Urine Culture  Status: Abnormal (Preliminary result)   Collection Time: 06/27/20  2:14 PM   Specimen: Urine, Random  Result Value Ref Range Status   Specimen Description   Final    URINE, RANDOM Performed at [facility]    Special Requests NONE Performed at [facility]   Final   Culture (A)  Final    >=100,000 COLONIES/mL GRAM NEGATIVE RODS SUSCEPTIBILITIES TO FOLLOW Performed at [facility], [address], [place], [place] [address]    Report Status PENDING  Incomplete  Blood culture (routine x 2)     Status: None (Preliminary result)   Collection Time: 06/27/20  2:39 PM   Specimen: Left Antecubital; Blood  Result Value Ref Range Status   Specimen Description   Final    LEFT ANTECUBITAL Performed at [facility]    Special Requests   Final    Blood Culture adequate volume Performed at [facility]    Culture   Final    NO GROWTH < 24 HOURS Performed at [facility], [address], [place], [place] [address]    Report Status PENDING  Incomplete  Resp Panel by RT-PCR (Flu A&B, Covid) Nasopharyngeal Swab     Status: None   Collection Time: 06/27/20   2:59 PM   Specimen: Nasopharyngeal Swab; Nasopharyngeal(NP) swabs in vial transport medium  Result Value Ref Range Status   SARS Coronavirus 2 by RT PCR NEGATIVE NEGATIVE Final    Comment: (NOTE) SARS-CoV-2 target nucleic acids are NOT DETECTED.  The SARS-CoV-2 RNA is generally detectable in upper respiratory specimens during the acute phase of infection. The lowest concentration of SARS-CoV-2 viral copies this assay can detect is 138 copies/mL. A negative result does not preclude SARS-Cov-2 infection and should not be used as the sole basis for treatment or other patient management decisions. A negative result may occur with  improper specimen collection/handling, submission of specimen other than nasopharyngeal swab, presence of viral mutation(s) within the areas targeted by this assay, and inadequate number of viral copies(<138 copies/mL). A negative result must be combined with clinical observations, patient history, and epidemiological information. The expected result is Negative.  Fact Sheet for Patients:  BloggerCourse.com  Fact Sheet for Healthcare Providers:  SeriousBroker.it  This test is no t yet approved or cleared by the [place] FDA and  has been authorized for detection and/or diagnosis of SARS-CoV-2 by FDA under an Emergency Use Authorization (EUA). This EUA will remain  in effect (meaning this test can be used) for the duration of the COVID-19 declaration under Section 564(b)(1) of the Act, 21 U.S.C.section 360bbb-3(b)(1), unless the authorization is terminated  or revoked sooner.       Influenza A by PCR NEGATIVE NEGATIVE Final   Influenza B by PCR NEGATIVE NEGATIVE Final    Comment: (NOTE) The Xpert Xpress SARS-CoV-2/FLU/RSV plus assay is intended as an aid in the diagnosis of influenza from Nasopharyngeal swab specimens and should not be used as a sole basis for treatment. Nasal washings and aspirates  are unacceptable for Xpert Xpress SARS-CoV-2/FLU/RSV testing.  Fact Sheet for Patients: BloggerCourse.com  Fact Sheet for Healthcare Providers: SeriousBroker.it  This test is not yet approved or cleared by the [place] FDA and has been authorized for detection and/or diagnosis of SARS-CoV-2 by FDA under an Emergency Use Authorization (EUA). This EUA will remain in effect (meaning this test can be used) for the duration of the COVID-19 declaration under Section  564(b)(1) of the Act, 21 U.S.C. section 360bbb-3(b)(1), unless the authorization is terminated or revoked.  Performed at [facility]   Blood culture (routine x 2)     Status: None (Preliminary result)   Collection Time: 06/27/20  3:58 PM   Specimen: BLOOD RIGHT FOREARM  Result Value Ref Range Status   Specimen Description   Final    BLOOD RIGHT FOREARM Performed at [facility]    Special Requests   Final    BOTTLES DRAWN AEROBIC AND ANAEROBIC Blood Culture adequate volume Performed at [facility]    Culture   Final    NO GROWTH < 24 HOURS Performed at [facility], [address] [place], [place] [address]    Report Status PENDING  Incomplete  MRSA PCR Screening     Status: None   Collection Time: 06/27/20  5:11 PM   Specimen: Nasal Mucosa; Nasopharyngeal  Result Value Ref Range Status   MRSA by PCR NEGATIVE NEGATIVE Final    Comment:        The GeneXpert MRSA Assay (FDA approved for NASAL specimens only), is one component of a comprehensive MRSA colonization surveillance program. It is not intended to diagnose MRSA infection nor to guide or monitor treatment for MRSA infections. Performed at [facility], [address] [place], [place] [address]       Radiology Studies: US PELVIC COMPLETE WITH TRANSVAGINAL  Result Date: 06/27/2020 CLINICAL DATA:  Vaginal bleeding.  EXAM: TRANSABDOMINAL AND TRANSVAGINAL ULTRASOUND OF PELVIS TECHNIQUE: Both transabdominal and transvaginal ultrasound examinations of the pelvis were performed. Transabdominal technique was performed for global imaging of the pelvis including uterus, ovaries, adnexal regions, and pelvic cul-de-sac. It was necessary to proceed with endovaginal exam following the transabdominal exam to visualize the endometrium. COMPARISON:  None FINDINGS: Uterus Measurements: 9.2 x 7.7 x 7.4 cm = volume: 227 mL. At least 3 uterine fibroids are noted. The largest measures 4 cm in the posterior uterus. Endometrium Thickness: 11 mm which is abnormally thickened for postmenopausal patient. No focal abnormality visualized. Right ovary Not visualized. Left ovary Measurements: 2.1 x 1.4 x 1.1 cm = volume: 2 mL. Normal appearance/no adnexal mass. Other findings No abnormal free fluid. IMPRESSION: Endometrium is abnormally thickened for postmenopausal patient. In the setting of post-menopausal bleeding, endometrial sampling is indicated to exclude carcinoma. If results are benign, sonohysterogram should be considered for focal lesion work-up. (Ref: Radiological Reasoning: Algorithmic Workup of Abnormal Vaginal Bleeding with Endovaginal Sonography and Sonohysterography. AJR 2008; 098:J19-14; 191:S68-73). Several uterine fibroids are noted, the largest measuring 4 mm. Left ovary is not visualized. Electronically Signed   By: Lupita RaiderJames  Green Jr M.D.   On: 06/27/2020 14:29      Scheduled Meds: . Chlorhexidine Gluconate Cloth  6 each Topical Daily  . insulin aspart  0-5 Units Subcutaneous QHS  . insulin aspart  0-9 Units Subcutaneous TID WC  . insulin glargine  10 Units Subcutaneous Daily  . lithium carbonate  450 mg Oral BID  . mouth rinse  15 mL Mouth Rinse BID  . pravastatin  40 mg Oral Daily  . sodium chloride flush  3 mL Intravenous Q12H   Continuous Infusions: . cefTRIAXone (ROCEPHIN)  IV    . lactated ringers Stopped (06/27/20 2000)      LOS: 1 day      Time spent: 30 minutes   Noralee StainJennifer Jarmar Rousseau, DO [facility] 06/28/2020, 12:19 PM   Available via Epic secure chat  7am-7pm After these hours, please refer to coverage provider listed on [email or web address]

## 2020-06-28 NOTE — Progress Notes (Signed)
Initial Nutrition Assessment  INTERVENTION:   -Recommend patient follow-up with outpatient dietitian for continued diet education  -Glucerna Shake po TID, each supplement provides 220 kcal and 10 grams of protein  -Provided "Carbohydrate Counting" handout in discharge instructions -reviewed with pt   NUTRITION DIAGNOSIS:   Inadequate oral intake related to chronic illness,poor appetite as evidenced by per patient/family report.  GOAL:   Patient will meet greater than or equal to 90% of their needs  MONITOR:   PO intake,Supplement acceptance,Labs,Weight trends,I & O's  REASON FOR ASSESSMENT:   Malnutrition Screening Tool,Consult Diet education  ASSESSMENT:   65 y.o. female with medical history significant of DM, HTN, HLD who presented to [facility] with chief complaint of generalized weakness and weight loss.  She also admitted to recurrent vaginal bleeding, postmenopausal bleeding for the past month. Denies fevers but has chills, no chest pain, nausea, vomiting, abdominal pain. She admits to exertional SOB. In the emergency department, she was found to have borderline low blood pressure 100/70, elevated blood sugar 664.  Patient reports eating 95% of breakfast this morning consisting of eggs, bacon, oranges, grits and 2% milk (providing ~475 kcals and 26g protein). Pt reports having good appetite some days (where she eats 3 meals a day) and then some days where she has no appetite and feels she gets full quickly.   RD noted that pt was seen by outpatient RD in 2020 for DM diet education. Pt states she remembers this and says she still feels like she doesn't know what to eat. She is told to not eat breads and cheese. RD explained about portion sizes. Will provide handout on carbohydrates in discharge instructions. Recommend continued education in outpatient setting.  Provided active listening as pt became upset when speaking about her life and illiteracy growing up which she attributes  to her poor health now. Very worried about having to start insulin.  Pt reports losing weight d/t a change in medication. States at her highest she weighed 135-140 lbs. Now weights 101 lbs this admission.   Medications: D5 infusion  Labs reviewed: CBGs: 119-259  NUTRITION - FOCUSED PHYSICAL EXAM:  Flowsheet Row Most Recent Value  Orbital Region No depletion  Upper Arm Region Mild depletion  Thoracic and Lumbar Region Unable to assess  Buccal Region No depletion  Temple Region Moderate depletion  Clavicle Bone Region No depletion  Clavicle and Acromion Bone Region No depletion  Scapular Bone Region No depletion  Dorsal Hand Mild depletion  Patellar Region No depletion  Anterior Thigh Region No depletion  Posterior Calf Region No depletion  Edema (RD Assessment) None  Hair Reviewed  Mouth Reviewed       Diet Order:   Diet Order            Diet Carb Modified Fluid consistency: Thin; Room service appropriate? Yes  Diet effective now                 EDUCATION NEEDS:   Education needs have been addressed  Skin:  Skin Assessment: Reviewed RN Assessment  Last BM:  4/25 -type 6  Height:   Ht Readings from Last 1 Encounters:  06/27/20 5\' 1"  (1.549 m)    Weight:   Wt Readings from Last 1 Encounters:  06/27/20 45.9 kg    BMI:  Body mass index is 19.1 kg/m.  Estimated Nutritional Needs:   Kcal:  1400-1600  Protein:  60-75g  Fluid:  1.6L/day   06/29/20, MS, RD, LDN Inpatient Clinical Dietitian  Contact information available via Amion

## 2020-06-28 NOTE — Progress Notes (Signed)
Pt belonging's packed. Pt was placed in a wheelchair to be brought up to 4W. Pt was brought up by charge nurse. Pt was stable upon leaving floor.

## 2020-06-28 NOTE — Discharge Instructions (Signed)

## 2020-06-29 DIAGNOSIS — E1142 Type 2 diabetes mellitus with diabetic polyneuropathy: Secondary | ICD-10-CM | POA: Diagnosis not present

## 2020-06-29 DIAGNOSIS — R739 Hyperglycemia, unspecified: Secondary | ICD-10-CM | POA: Diagnosis not present

## 2020-06-29 DIAGNOSIS — N938 Other specified abnormal uterine and vaginal bleeding: Secondary | ICD-10-CM | POA: Diagnosis not present

## 2020-06-29 DIAGNOSIS — E1100 Type 2 diabetes mellitus with hyperosmolarity without nonketotic hyperglycemic-hyperosmolar coma (NKHHC): Secondary | ICD-10-CM | POA: Diagnosis not present

## 2020-06-29 LAB — CBC
HCT: 40.2 % (ref 36.0–46.0)
Hemoglobin: 13.2 g/dL (ref 12.0–15.0)
MCH: 29.9 pg (ref 26.0–34.0)
MCHC: 32.8 g/dL (ref 30.0–36.0)
MCV: 91.2 fL (ref 80.0–100.0)
Platelets: 191 10*3/uL (ref 150–400)
RBC: 4.41 MIL/uL (ref 3.87–5.11)
RDW: 13 % (ref 11.5–15.5)
WBC: 8.9 10*3/uL (ref 4.0–10.5)
nRBC: 0 % (ref 0.0–0.2)

## 2020-06-29 LAB — GLUCOSE, CAPILLARY
Glucose-Capillary: 283 mg/dL — ABNORMAL HIGH (ref 70–99)
Glucose-Capillary: 308 mg/dL — ABNORMAL HIGH (ref 70–99)

## 2020-06-29 LAB — BASIC METABOLIC PANEL
Anion gap: 8 (ref 5–15)
BUN: 25 mg/dL — ABNORMAL HIGH (ref 8–23)
CO2: 18 mmol/L — ABNORMAL LOW (ref 22–32)
Calcium: 8.7 mg/dL — ABNORMAL LOW (ref 8.9–10.3)
Chloride: 103 mmol/L (ref 98–111)
Creatinine, Ser: 0.81 mg/dL (ref 0.44–1.00)
GFR, Estimated: >60 mL/min (ref >60)
Glucose, Bld: 380 mg/dL — ABNORMAL HIGH (ref 70–99)
Potassium: 5.1 mmol/L (ref 3.5–5.1)
Sodium: 129 mmol/L — ABNORMAL LOW (ref 135–145)

## 2020-06-29 LAB — URINE CULTURE: Culture: >=100000 — AB

## 2020-06-29 NOTE — Progress Notes (Signed)
Pt discharged to home, instructions reviewed with pt. Pt acknowledged understanding. Pt to follow up with out pt appointment as planned. SRP, RN

## 2020-06-29 NOTE — Progress Notes (Signed)
Inpatient Diabetes Program Recommendations  AACE/ADA: New Consensus Statement on Inpatient Glycemic Control (2015)  Target Ranges:  Prepandial:   less than 140 mg/dL      Peak postprandial:   less than 180 mg/dL (1-2 hours)      Critically ill patients:  140 - 180 mg/dL   Lab Results  Component Value Date   GLUCAP 283 (H) 06/29/2020   HGBA1C 13.2 (H) 06/27/2020    Review of Glycemic Control  Diabetes history: DM2 Outpatient Diabetes medications: Amaryl 4 mg QD, metformin 1000 mg BID Current orders for Inpatient glycemic control: Lantus 10 units QD, Novolog 0-9 units TID with meals and 0-5 HS  HgbA1C - 13.2% 380, 283 mg/dL today  Inpatient Diabetes Program Recommendations:     Spoke with pt at length yesterday afternoon about her diabetes control and HgbA1C of 132.2%. Pt states her PCP has also spoken with her about the need to start insulin. I demonstrated insulin pen use and pt was teary-eyed and said she would rather die than stick herself with a needle. I asked her if someone else could give it to her. Pt said she would speak with her daughter about it, but really did not want to go home on insulin. I explained why she needed insulin and goal was to prevent short and long-term complications from high blood sugars. Pt said she would try to exercise more and will take metformin and Amaryl on a regular basis. She has agreed to check her blood sugars and take logbook to PCP.  Will continue to follow.   Thank you. Ailene Ards, RD, LDN, CDE Inpatient Diabetes Coordinator [phone]

## 2020-06-29 NOTE — Progress Notes (Addendum)
Pt reports she no long take Lithium CR, MD made aware, order received. SRP, RN

## 2020-06-29 NOTE — Discharge Summary (Addendum)
Physician Discharge Summary  Amber Patterson QAS:[id number] DOB: 12-17-1955 DOA: 06/27/2020  PCP: Doristine Bosworth, MD  Admit date: 06/27/2020 Discharge date: 06/29/2020  Recommendations for Outpatient Follow-up:   HHS, DM type 2 not on insulin. Hgb A1c 13.2 -- Resolved with standard treatment.  Patient did not want to start insulin.  She is afraid of needles.  Daughter however will help her check her blood sugars.  F/u with outpatient dietitian, follow-up with outpatient physician.  Consider encouraging patient to start insulin.  DUB --Endometrium is abnormally thickened for postmenopausal patient. In the setting of post-menopausal bleeding, endometrial sampling is indicated to exclude carcinoma. Uterine fibroids. --f/u w/ GYN as an outpatient    Follow-up Information    Doristine Bosworth, MD. Schedule an appointment as soon as possible for a visit in 1 week(s).   Specialty: Internal Medicine Contact information: [address] [place] [address] [phone]        [facility]. Schedule an appointment as soon as possible for a visit in 1 week(s).   Specialty: Obstetrics and Gynecology Why: A message as been sent to the office to reach out to schedule your appointment. please call if you have not hear from them.  Contact information: [address] [place] [address] [phone]               Discharge Diagnoses: Principal diagnosis is #1 Principal Problem:   Hyperosmolar hyperglycemic state (HHS) (HCC) Active Problems:   HTN (hypertension)   HLD (hyperlipidemia)   DM (diabetes mellitus), type 2 (HCC)   Bipolar disease, chronic (HCC)   Hypotension   Dysfunctional uterine bleeding   Discharge Condition: improved Disposition: home  Diet recommendation:  Diet Orders (From admission, onward)    Start     Ordered   06/29/20 0000  Diet - low sodium heart healthy        06/29/20 1228    06/29/20 0000  Diet Carb Modified        06/29/20 1228   06/28/20 0725  Diet Carb Modified Fluid consistency: Thin; Room service appropriate? Yes  Diet effective now       Question Answer Comment  Diet-HS Snack? Nothing   Calorie Level Medium 1600-2000   Fluid consistency: Thin   Room service appropriate? Yes      06/28/20 0724           Filed Weights   06/27/20 0952  Weight: 45.9 kg    HPI/Hospital Course:   64yow PMH DM presented w/ generalized weakness, weight loss, vaginal bleeding x1 month, exertional SOB. Admitted for HHS.  This rapidly resolved.  Bleeding was insignificant.  Plans for outpatient follow-up.  HHS, DM type 2 not on insulin. Hgb A1c 13.2 -- Resolved with standard treatment.  Patient did not want to start insulin.  She is afraid of needles.  Daughter however will help her check her blood sugars.  F/u with outpatient dietitian, follow-up with outpatient physician.  Consider encouraging patient to start insulin.  DUB --Endometrium is abnormally thickened for postmenopausal patient. In the setting of post-menopausal bleeding, endometrial sampling is indicated to exclude carcinoma. Uterine fibroids. --f/u w/ GYN as an outpatient  Essential HTN -- Stable  Bacturia --asymptomatic and therefore not treated.   Consults:  . None  Today's assessment: S: CC: f/u high blood sugar  Feels better, tolerating diet, ready to go home.  Will not inject self with insulin.  Daughter is going to  help her check her blood sugars.  Patient afraid of needles, she does not want to start insulin.  She is going to work on her diet and cut out juice.  O: Vitals:  Vitals:   06/29/20 0637 06/29/20 1300  BP: 103/76 115/78  Pulse: 76 78  Resp: 17 16  Temp: 98.9 F (37.2 C) 99.1 F (37.3 C)  SpO2: 98% 100%    Constitutional:  . Appears calm and comfortable ENMT:  . grossly normal hearing  Respiratory:  . CTA bilaterally, no w/r/r.  . Respiratory effort  normal. Cardiovascular:  . RRR, no m/r/g . No LE extremity edema   Psychiatric:  . Mental status o Mood, affect appropriate  CBG 200-300 Corrected sodium 133, remainder BMP unremarkable anion gap within normal limits CBC unremarkable  Discharge Instructions  Discharge Instructions    Amb Referral to Nutrition and Diabetic Education   Complete by: As directed    Diet - low sodium heart healthy   Complete by: As directed    Diet Carb Modified   Complete by: As directed    Discharge instructions   Complete by: As directed    Call your physician or seek immediate medical attention for blood sugar over 400 or less than 70, bleeding, pain, vomiting, or worsening of condition.   Increase activity slowly   Complete by: As directed      Allergies as of 06/29/2020   No Known Allergies     Medication List    STOP taking these medications   diphenhydrAMINE 25 MG tablet Commonly known as: BENADRYL   lithium carbonate 450 MG CR tablet Commonly known as: ESKALITH   pravastatin 40 MG tablet Commonly known as: PRAVACHOL     TAKE these medications   aspirin EC 81 MG tablet Take 81 mg by mouth daily.   [employer] HAIR/SKIN/NAILS PO Take 1 tablet by mouth daily.   gabapentin 300 MG capsule Commonly known as: NEURONTIN Take 300 mg by mouth 2 (two) times daily.   glimepiride 4 MG tablet Commonly known as: Amaryl Take 1 tablet (4 mg total) by mouth daily with breakfast.   lisinopril 20 MG tablet Commonly known as: ZESTRIL Take 1 tablet (20 mg total) by mouth daily.   megestrol 40 MG/ML suspension Commonly known as: MEGACE Take 800 mg by mouth daily.   metFORMIN 1000 MG tablet Commonly known as: GLUCOPHAGE Take 1,000 mg by mouth 2 (two) times daily.     patient reported she no longer takes Lithium No Known Allergies  The results of significant diagnostics from this hospitalization (including imaging, microbiology, ancillary and laboratory) are listed below for reference.     Significant Diagnostic Studies: US PELVIC COMPLETE WITH TRANSVAGINAL  Result Date: 06/27/2020 CLINICAL DATA:  Vaginal bleeding. EXAM: TRANSABDOMINAL AND TRANSVAGINAL ULTRASOUND OF PELVIS TECHNIQUE: Both transabdominal and transvaginal ultrasound examinations of the pelvis were performed. Transabdominal technique was performed for global imaging of the pelvis including uterus, ovaries, adnexal regions, and pelvic cul-de-sac. It was necessary to proceed with endovaginal exam following the transabdominal exam to visualize the endometrium. COMPARISON:  None FINDINGS: Uterus Measurements: 9.2 x 7.7 x 7.4 cm = volume: 227 mL. At least 3 uterine fibroids are noted. The largest measures 4 cm in the posterior uterus. Endometrium Thickness: 11 mm which is abnormally thickened for postmenopausal patient. No focal abnormality visualized. Right ovary Not visualized. Left ovary Measurements: 2.1 x 1.4 x 1.1 cm = volume: 2 mL. Normal appearance/no adnexal mass. Other findings No abnormal free fluid.  IMPRESSION: Endometrium is abnormally thickened for postmenopausal patient. In the setting of post-menopausal bleeding, endometrial sampling is indicated to exclude carcinoma. If results are benign, sonohysterogram should be considered for focal lesion work-up. (Ref: Radiological Reasoning: Algorithmic Workup of Abnormal Vaginal Bleeding with Endovaginal Sonography and Sonohysterography. AJR 2008; 443:X54-00). Several uterine fibroids are noted, the largest measuring 4 mm. Left ovary is not visualized. Electronically Signed   By: Lupita Raider M.D.   On: 06/27/2020 14:29    Microbiology: Recent Results (from the past 240 hour(s))  Urine Culture     Status: Abnormal   Collection Time: 06/27/20  2:14 PM   Specimen: Urine, Random  Result Value Ref Range Status   Specimen Description   Final    URINE, RANDOM Performed at [facility], [address] [place], [place] [address]    Special Requests    Final    NONE Performed at [facility], [address], [place], [place] [address]    Culture >=100,000 COLONIES/mL ENTEROBACTER CLOACAE (A)  Final   Report Status 06/29/2020 FINAL  Final   Organism ID, Bacteria ENTEROBACTER CLOACAE (A)  Final      Susceptibility   Enterobacter cloacae - MIC*    CEFAZOLIN >=64 RESISTANT Resistant     CEFEPIME <=0.12 SENSITIVE Sensitive     CIPROFLOXACIN <=0.25 SENSITIVE Sensitive     GENTAMICIN <=1 SENSITIVE Sensitive     IMIPENEM <=0.25 SENSITIVE Sensitive     NITROFURANTOIN <=16 SENSITIVE Sensitive     TRIMETH/SULFA <=20 SENSITIVE Sensitive     PIP/TAZO <=4 SENSITIVE Sensitive     * >=100,000 COLONIES/mL ENTEROBACTER CLOACAE  Culture, Urine     Status: Abnormal (Preliminary result)   Collection Time: 06/27/20  2:14 PM   Specimen: Urine, Random  Result Value Ref Range Status   Specimen Description   Final    URINE, RANDOM Performed at [facility], [address], [place], [place] [address]    Special Requests   Final    NONE Performed at [facility], [address] [place], [place] [address]    Culture (A)  Final    20,000 COLONIES/mL GRAM NEGATIVE RODS SUSCEPTIBILITIES TO FOLLOW Performed at [facility], [address] [place], [place] [address]    Report Status PENDING  Incomplete  Blood culture (routine x 2)     Status: None (Preliminary result)   Collection Time: 06/27/20  2:39 PM   Specimen: Left Antecubital; Blood  Result Value Ref Range Status   Specimen Description   Final    LEFT ANTECUBITAL Performed at [facility], [address], [place], [place] [address]    Special Requests   Final    Blood Culture adequate volume Performed at [facility], [address], [place], [place] [address]    Culture   Final    NO GROWTH 2 DAYS Performed at [facility], [address] [place], [place] [address]    Report  Status PENDING  Incomplete  Resp Panel by RT-PCR (Flu A&B, Covid) Nasopharyngeal Swab     Status: None   Collection Time: 06/27/20  2:59 PM   Specimen: Nasopharyngeal Swab; Nasopharyngeal(NP) swabs in vial transport medium  Result Value Ref Range Status   SARS Coronavirus 2 by RT PCR NEGATIVE NEGATIVE Final    Comment: (NOTE) SARS-CoV-2 target nucleic acids are NOT DETECTED.  The SARS-CoV-2 RNA is generally detectable in upper respiratory specimens during the acute phase  of infection. The lowest concentration of SARS-CoV-2 viral copies this assay can detect is 138 copies/mL. A negative result does not preclude SARS-Cov-2 infection and should not be used as the sole basis for treatment or other patient management decisions. A negative result may occur with  improper specimen collection/handling, submission of specimen other than nasopharyngeal swab, presence of viral mutation(s) within the areas targeted by this assay, and inadequate number of viral copies(<138 copies/mL). A negative result must be combined with clinical observations, patient history, and epidemiological information. The expected result is Negative.  Fact Sheet for Patients:  BloggerCourse.comhttps://www.fda.gov/media/152166/download  Fact Sheet for Healthcare Providers:  SeriousBroker.ithttps://www.fda.gov/media/152162/download  This test is no t yet approved or cleared by the [place] FDA and  has been authorized for detection and/or diagnosis of SARS-CoV-2 by FDA under an Emergency Use Authorization (EUA). This EUA will remain  in effect (meaning this test can be used) for the duration of the COVID-19 declaration under Section 564(b)(1) of the Act, 21 U.S.C.section 360bbb-3(b)(1), unless the authorization is terminated  or revoked sooner.       Influenza A by PCR NEGATIVE NEGATIVE Final   Influenza B by PCR NEGATIVE NEGATIVE Final    Comment: (NOTE) The Xpert Xpress SARS-CoV-2/FLU/RSV plus assay is intended as an aid in the diagnosis  of influenza from Nasopharyngeal swab specimens and should not be used as a sole basis for treatment. Nasal washings and aspirates are unacceptable for Xpert Xpress SARS-CoV-2/FLU/RSV testing.  Fact Sheet for Patients: BloggerCourse.comhttps://www.fda.gov/media/152166/download  Fact Sheet for Healthcare Providers: SeriousBroker.ithttps://www.fda.gov/media/152162/download  This test is not yet approved or cleared by the [place] FDA and has been authorized for detection and/or diagnosis of SARS-CoV-2 by FDA under an Emergency Use Authorization (EUA). This EUA will remain in effect (meaning this test can be used) for the duration of the COVID-19 declaration under Section 564(b)(1) of the Act, 21 U.S.C. section 360bbb-3(b)(1), unless the authorization is terminated or revoked.  Performed at [facility]   Blood culture (routine x 2)     Status: None (Preliminary result)   Collection Time: 06/27/20  3:58 PM   Specimen: BLOOD RIGHT FOREARM  Result Value Ref Range Status   Specimen Description   Final    BLOOD RIGHT FOREARM Performed at [facility], [address], [place], [place] [address]    Special Requests   Final    BOTTLES DRAWN AEROBIC AND ANAEROBIC Blood Culture adequate volume Performed at [facility], [address], [place], [place] [address]    Culture   Final    NO GROWTH 2 DAYS Performed at [facility], [address] [place], [place] [address]    Report Status PENDING  Incomplete  MRSA PCR Screening     Status: None   Collection Time: 06/27/20  5:11 PM   Specimen: Nasal Mucosa; Nasopharyngeal  Result Value Ref Range Status   MRSA by PCR NEGATIVE NEGATIVE Final    Comment:        The GeneXpert MRSA Assay (FDA approved for NASAL specimens only), is one component of a comprehensive MRSA colonization surveillance program. It is not intended to diagnose MRSA infection nor to guide or monitor treatment for MRSA  infections. Performed at [facility], [address] [place], [place] [address]      Labs: Basic Metabolic Panel: Recent Labs  Lab 06/27/20 2136 06/28/20 0139 06/28/20 0544 06/28/20 0921 06/29/20 0345  NA 130* 128* 132* 136 129*  K 4.8 4.4  4.2 4.5 5.1  CL 107 104 107 108 103  CO2 17* 18* 21* 25 18*  GLUCOSE 226* 190* 156* 148* 380*  BUN 25*  CREATININE 0.91 0.78 0.61 0.58 0.81  CALCIUM 8.3* 8.0* 8.5* 8.9 8.7*   Liver Function Tests: Recent Labs  Lab 06/27/20 1040  AST 5*  ALT 6  ALKPHOS 49  BILITOT 0.6  PROT 6.9  ALBUMIN 4.1   CBC: Recent Labs  Lab 06/27/20 1040 06/28/20 0139 06/29/20 0345  WBC 7.4 7.5 8.9  HGB 13.7 11.7* 13.2  HCT 41.2 36.9 40.2  MCV 89.6 92.9 91.2  PLT 301 247 191   CBG: Recent Labs  Lab 06/28/20 0747 06/28/20 1137 06/28/20 2147 06/29/20 0745 06/29/20 1307  GLUCAP 119* 259* 368* 283* 308*    Principal Problem:   Hyperosmolar hyperglycemic state (HHS) (HCC) Active Problems:   HTN (hypertension)   HLD (hyperlipidemia)   DM (diabetes mellitus), type 2 (HCC)   Bipolar disease, chronic (HCC)   Hypotension   Dysfunctional uterine bleeding   Time coordinating discharge: 35 minutes  Signed:  Brendia Sacks, MD  [facility]  06/29/2020, 5:54 PM

## 2020-06-29 NOTE — Hospital Course (Signed)
64yow PMH DM presented w/ generalized weakness, weight loss, vaginal bleeding x1 month, exertional SOB. Admitted for HHS.  A & P  HHS, DM type 2 not on insluin. Hgb A1c 13.2 --f/u with outpatient dietitian  DUB --Endometrium is abnormally thickened for postmenopausal patient. In the setting of post-menopausal bleeding, endometrial sampling is indicated to exclude carcinoma. Uterine fibroids. --f/u w/ GYN as an outpatient  Essential HTN --  Pyuria  --asymptomatic.

## 2020-06-30 LAB — URINE CULTURE: Culture: 20000 — AB

## 2020-07-02 LAB — CULTURE, BLOOD (ROUTINE X 2)
Culture: NO GROWTH
Culture: NO GROWTH
Special Requests: ADEQUATE
Special Requests: ADEQUATE

## 2020-07-08 ENCOUNTER — Encounter: Payer: Self-pay | Admitting: Obstetrics and Gynecology

## 2020-07-08 ENCOUNTER — Encounter: Payer: 59 | Admitting: Obstetrics and Gynecology

## 2020-07-11 NOTE — Progress Notes (Signed)
Patient did not keep her GYN ED referral appointment for 07/08/2020.  Cornelia Copa MD Attending [facility] [employer] [facility])

## 2020-08-18 ENCOUNTER — Ambulatory Visit: Payer: 59 | Admitting: Registered"

## 2020-08-24 ENCOUNTER — Encounter: Payer: Self-pay | Admitting: Podiatry

## 2020-08-24 ENCOUNTER — Other Ambulatory Visit: Payer: Self-pay

## 2020-08-24 ENCOUNTER — Ambulatory Visit (INDEPENDENT_AMBULATORY_CARE_PROVIDER_SITE_OTHER): Payer: 59 | Admitting: Podiatry

## 2020-08-24 DIAGNOSIS — E1142 Type 2 diabetes mellitus with diabetic polyneuropathy: Secondary | ICD-10-CM | POA: Diagnosis not present

## 2020-08-24 DIAGNOSIS — G629 Polyneuropathy, unspecified: Secondary | ICD-10-CM | POA: Insufficient documentation

## 2020-08-24 DIAGNOSIS — B351 Tinea unguium: Secondary | ICD-10-CM

## 2020-08-24 DIAGNOSIS — M79675 Pain in left toe(s): Secondary | ICD-10-CM | POA: Diagnosis not present

## 2020-08-24 DIAGNOSIS — M79674 Pain in right toe(s): Secondary | ICD-10-CM

## 2020-08-24 DIAGNOSIS — E1165 Type 2 diabetes mellitus with hyperglycemia: Secondary | ICD-10-CM | POA: Insufficient documentation

## 2020-08-24 DIAGNOSIS — Z794 Long term (current) use of insulin: Secondary | ICD-10-CM | POA: Insufficient documentation

## 2020-08-24 NOTE — Progress Notes (Signed)
This patient returns to my office for at risk foot care.  This patient requires this care by a professional since this patient will be at risk due to having diabetic neuropathy.  This patient is unable to cut nails herself since the patient cannot reach her nails.These nails are painful walking and wearing shoes.  This patient presents for at risk foot care today.  General Appearance  Alert, conversant and in no acute stress.  Vascular  Dorsalis pedis and posterior tibial  pulses are palpable  bilaterally.  Capillary return is within normal limits  bilaterally. Temperature is within normal limits  bilaterally.  Neurologic  Senn-Weinstein monofilament wire test diminished  bilaterally. Muscle power within normal limits bilaterally.  Nails Thick disfigured discolored nails with subungual debris  from hallux to fifth toes bilaterally. No evidence of bacterial infection or drainage bilaterally.  Orthopedic  No limitations of motion  feet .  No crepitus or effusions noted.  No bony pathology .  Hammer toes  B/L.  Skin  normotropic skin with no porokeratosis noted bilaterally.  No signs of infections or ulcers noted.     Onychomycosis  Pain in right toes  Pain in left toes  Consent was obtained for treatment procedures.   Mechanical debridement of nails 1-5  bilaterally performed with a nail nipper.  Filed with dremel without incident.    Return office visit    3 months                  Told patient to return for periodic foot care and evaluation due to potential at risk complications.   Jerad Dunlap DPM   

## 2020-08-30 ENCOUNTER — Other Ambulatory Visit: Payer: Self-pay | Admitting: Physician Assistant

## 2020-08-30 DIAGNOSIS — Z1231 Encounter for screening mammogram for malignant neoplasm of breast: Secondary | ICD-10-CM

## 2020-09-30 ENCOUNTER — Ambulatory Visit: Payer: 59 | Admitting: Registered"

## 2020-10-21 ENCOUNTER — Other Ambulatory Visit: Payer: Self-pay

## 2020-10-21 ENCOUNTER — Ambulatory Visit
Admission: RE | Admit: 2020-10-21 | Discharge: 2020-10-21 | Disposition: A | Discharge status: Home / Self Care | Payer: 59 | Source: Ambulatory Visit | Attending: Physician Assistant | Admitting: Physician Assistant

## 2020-10-21 DIAGNOSIS — Z1231 Encounter for screening mammogram for malignant neoplasm of breast: Secondary | ICD-10-CM

## 2020-11-30 ENCOUNTER — Ambulatory Visit: Payer: 59 | Admitting: Podiatry

## 2020-11-30 ENCOUNTER — Other Ambulatory Visit: Payer: Self-pay

## 2020-11-30 ENCOUNTER — Encounter: Payer: Self-pay | Admitting: Podiatry

## 2020-11-30 DIAGNOSIS — M79674 Pain in right toe(s): Secondary | ICD-10-CM | POA: Diagnosis not present

## 2020-11-30 DIAGNOSIS — M79675 Pain in left toe(s): Secondary | ICD-10-CM

## 2020-11-30 DIAGNOSIS — B351 Tinea unguium: Secondary | ICD-10-CM | POA: Diagnosis not present

## 2020-11-30 DIAGNOSIS — E1142 Type 2 diabetes mellitus with diabetic polyneuropathy: Secondary | ICD-10-CM | POA: Diagnosis not present

## 2020-11-30 NOTE — Progress Notes (Signed)
This patient returns to my office for at risk foot care.  This patient requires this care by a professional since this patient will be at risk due to having diabetic neuropathy.  This patient is unable to cut nails herself since the patient cannot reach her nails.These nails are painful walking and wearing shoes.  This patient presents for at risk foot care today.  General Appearance  Alert, conversant and in no acute stress.  Vascular  Dorsalis pedis and posterior tibial  pulses are palpable  bilaterally.  Capillary return is within normal limits  bilaterally. Temperature is within normal limits  bilaterally.  Neurologic  Senn-Weinstein monofilament wire test diminished  bilaterally. Muscle power within normal limits bilaterally.  Nails Thick disfigured discolored nails with subungual debris  from hallux to fifth toes bilaterally. No evidence of bacterial infection or drainage bilaterally.  Orthopedic  No limitations of motion  feet .  No crepitus or effusions noted.  No bony pathology .  Hammer toes  B/L.  Skin  normotropic skin with no porokeratosis noted bilaterally.  No signs of infections or ulcers noted.     Onychomycosis  Pain in right toes  Pain in left toes  Consent was obtained for treatment procedures.   Mechanical debridement of nails 1-5  bilaterally performed with a nail nipper.  Filed with dremel without incident.    Return office visit    3 months                  Told patient to return for periodic foot care and evaluation due to potential at risk complications.   Aadyn Buchheit DPM   

## 2021-03-01 ENCOUNTER — Ambulatory Visit: Payer: 59 | Admitting: Podiatry

## 2021-11-22 ENCOUNTER — Other Ambulatory Visit: Payer: Self-pay | Admitting: Physician Assistant

## 2021-11-22 DIAGNOSIS — Z1231 Encounter for screening mammogram for malignant neoplasm of breast: Secondary | ICD-10-CM

## 2021-12-03 IMAGING — US US PELVIS COMPLETE WITH TRANSVAGINAL
1 series · 13 of 25 positions shown · non-contrast
Comparison: None

CLINICAL DATA: Vaginal bleeding.



[Series 1: us pelvic complete with transvaginal · 74 acquisitions, 13 frames shown]
[im 1/74]
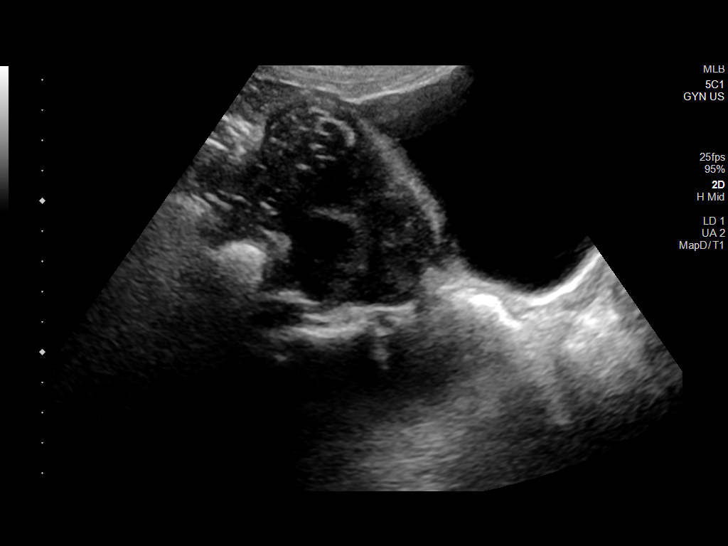
[im 7/74]
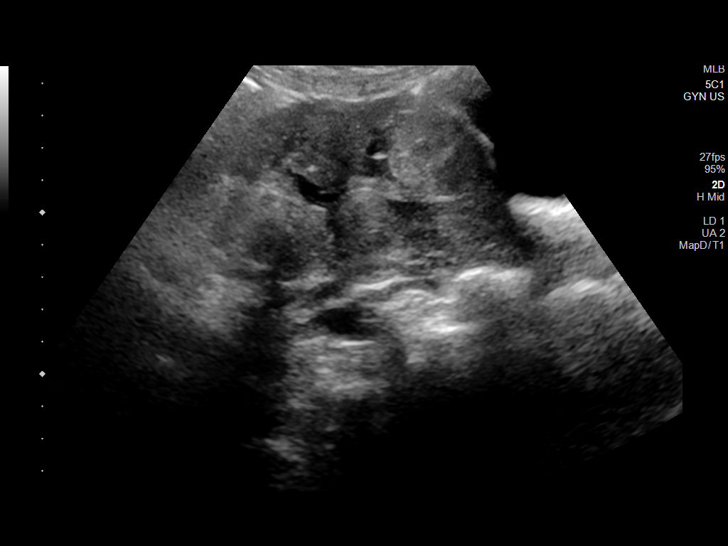
[im 13/74]
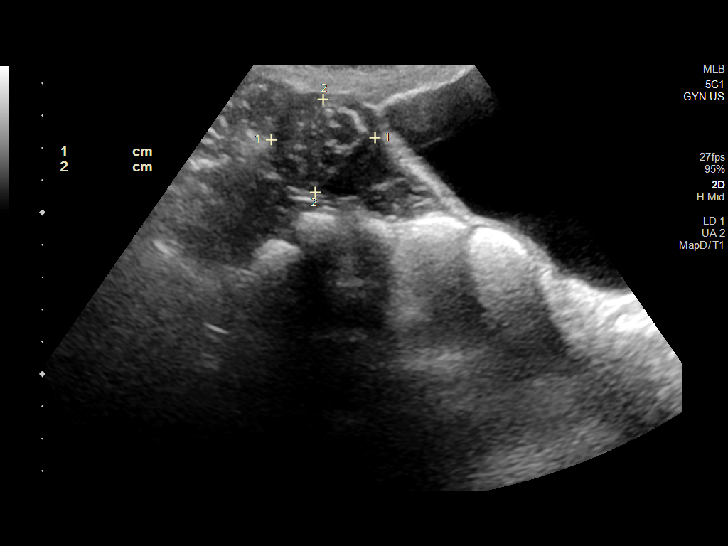
[im 19/74]
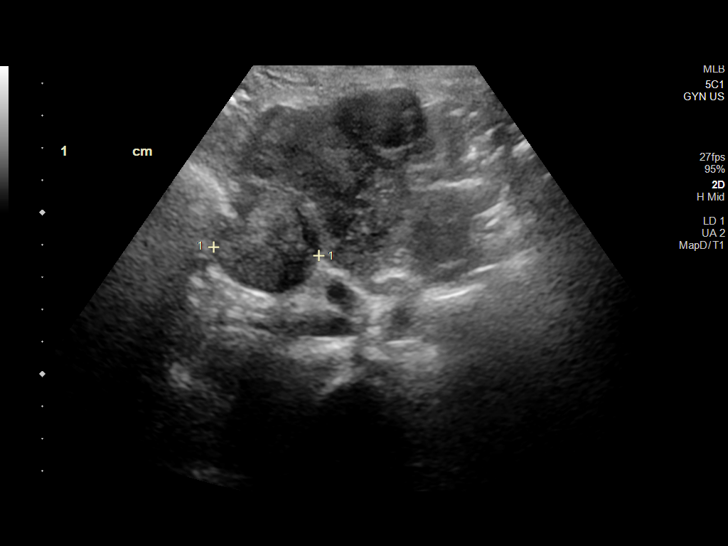
[im 25/74]
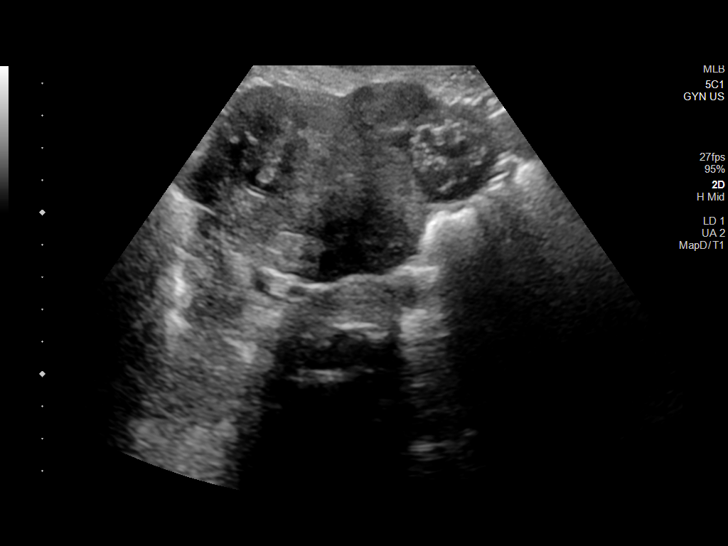
[im 31/74]
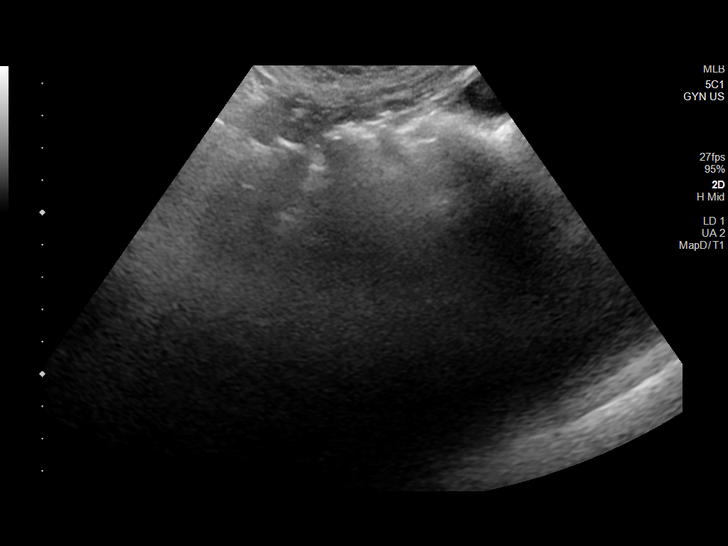
[im 37/74]
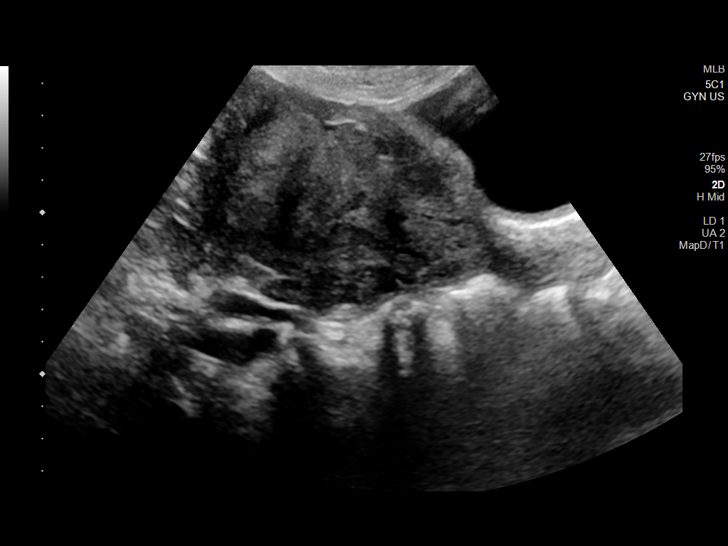
[im 43/74]
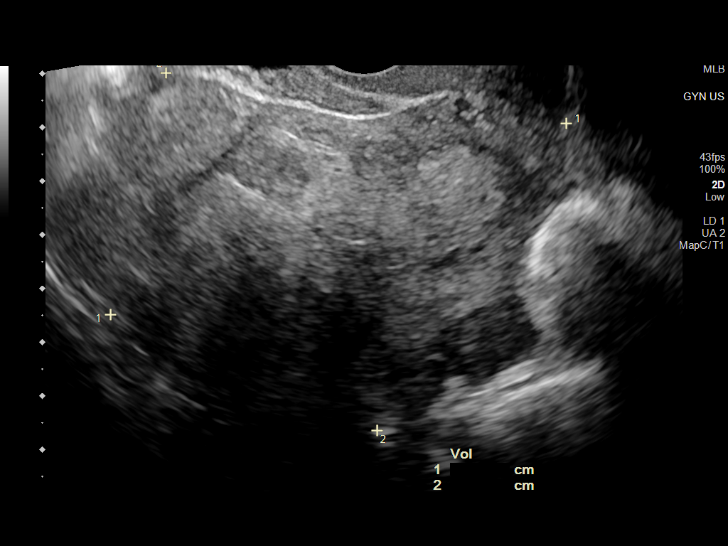
[im 49/74]
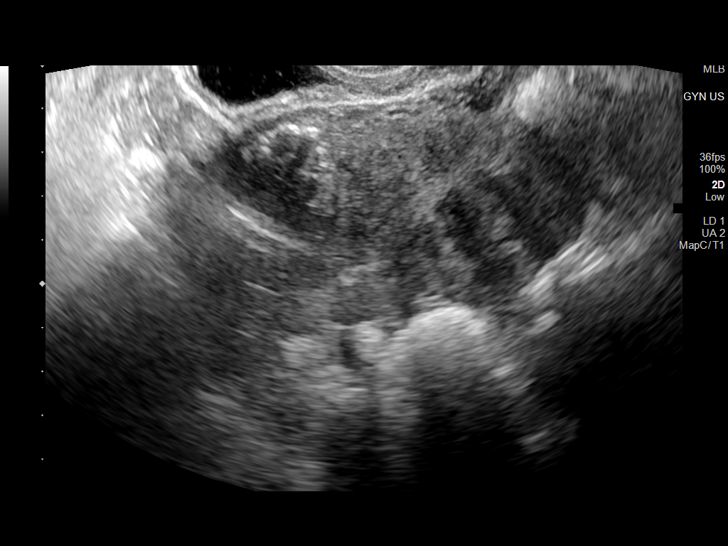
[im 55/74]
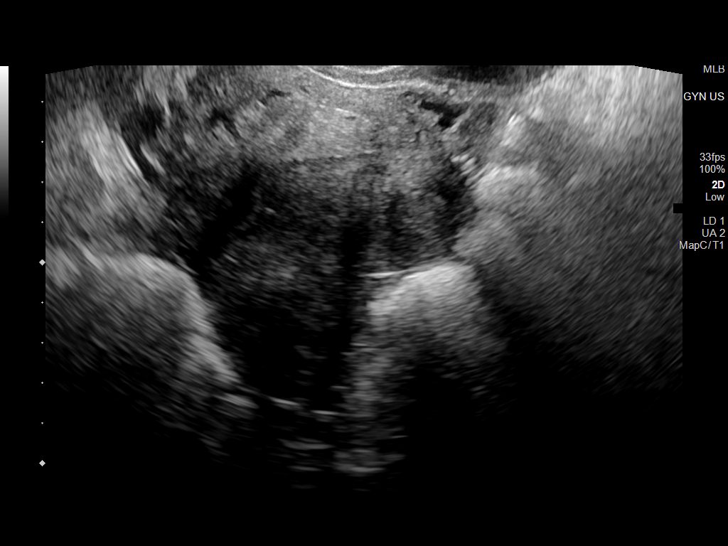
[im 61/74]
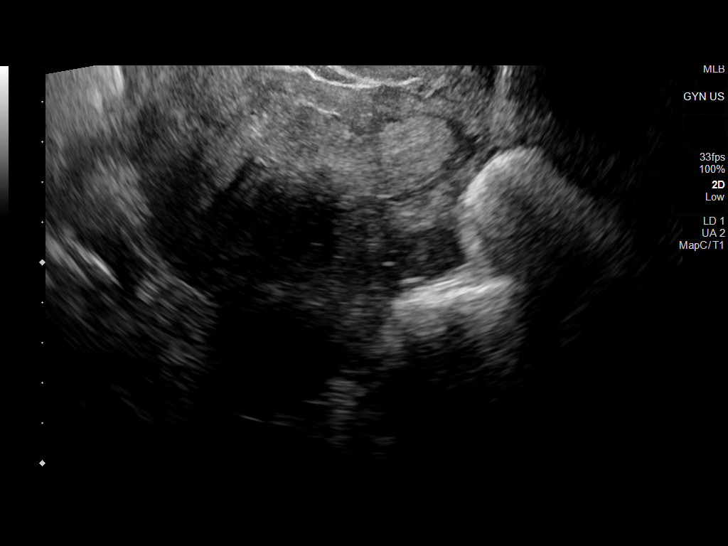
[im 67/74]
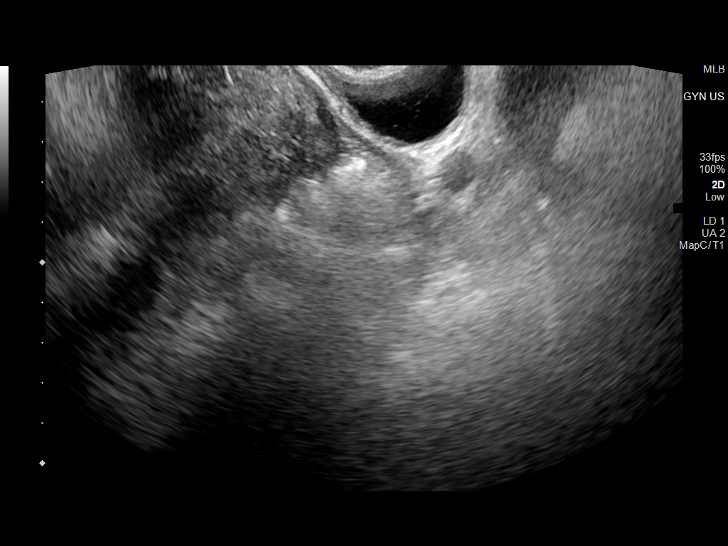
[im 74/74]
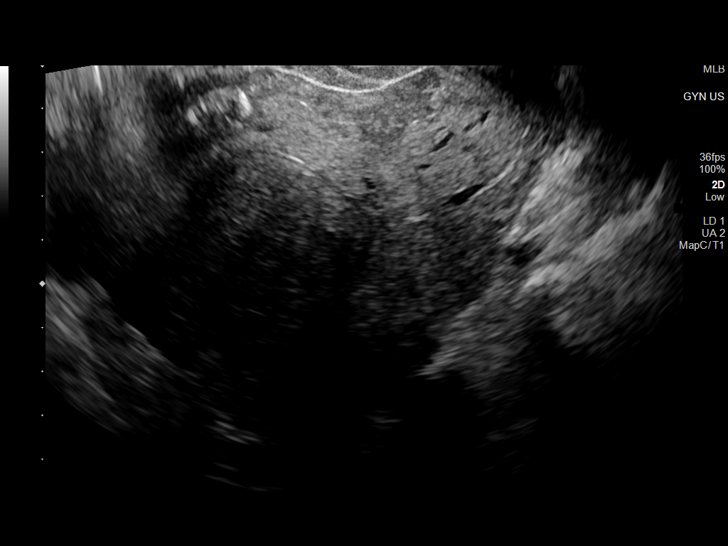

[13 of 25 positions shown; findings below may reference images not displayed]

FINDINGS: Uterus

Measurements: 9.2 x 7.7 x 7.4 cm = volume: 227 mL. At least 3
uterine fibroids are noted. The largest measures 4 cm in the
posterior uterus.

Endometrium

Thickness: 11 mm which is abnormally thickened for postmenopausal
patient. No focal abnormality visualized.

Right ovary

Not visualized.

Left ovary

Measurements: 2.1 x 1.4 x 1.1 cm = volume: 2 mL. Normal
appearance/no adnexal mass.

Other findings

No abnormal free fluid.
IMPRESSION: Endometrium is abnormally thickened for postmenopausal patient. In
the setting of post-menopausal bleeding, endometrial sampling is
indicated to exclude carcinoma. If results are benign,
sonohysterogram should be considered for focal lesion work-up. (Ref:
Radiological Reasoning: Algorithmic Workup of Abnormal Vaginal
Bleeding with Endovaginal Sonography and Sonohysterography. AJR
5330; 191:S68-73).

Several uterine fibroids are noted, the largest measuring 4 mm.

Left ovary is not visualized.

## 2021-12-18 ENCOUNTER — Other Ambulatory Visit: Payer: Self-pay | Admitting: Nurse Practitioner

## 2021-12-18 DIAGNOSIS — Z1231 Encounter for screening mammogram for malignant neoplasm of breast: Secondary | ICD-10-CM

## 2021-12-19 ENCOUNTER — Ambulatory Visit: Payer: 59

## 2022-01-12 ENCOUNTER — Other Ambulatory Visit: Payer: Self-pay | Admitting: Nurse Practitioner

## 2022-01-12 DIAGNOSIS — Z780 Asymptomatic menopausal state: Secondary | ICD-10-CM

## 2022-01-31 ENCOUNTER — Ambulatory Visit
Admission: RE | Admit: 2022-01-31 | Discharge: 2022-01-31 | Disposition: A | Discharge status: Home / Self Care | Payer: Medicare Other | Source: Ambulatory Visit | Attending: Nurse Practitioner | Admitting: Nurse Practitioner

## 2022-01-31 DIAGNOSIS — Z1231 Encounter for screening mammogram for malignant neoplasm of breast: Secondary | ICD-10-CM

## 2022-03-29 IMAGING — MG MM DIGITAL SCREENING BILAT W/ TOMO AND CAD
8 series · 9 of 24 positions shown · non-contrast
Comparison: Previous exam(s).

CLINICAL DATA: Screening.

EXAM:
DIGITAL SCREENING BILATERAL MAMMOGRAM WITH TOMOSYNTHESIS AND CAD
TECHNIQUE: Bilateral screening digital craniocaudal and mediolateral oblique
mammograms were obtained. Bilateral screening digital breast
tomosynthesis was performed. The images were evaluated with
computer-aided detection.

[R MLO synth-2D]
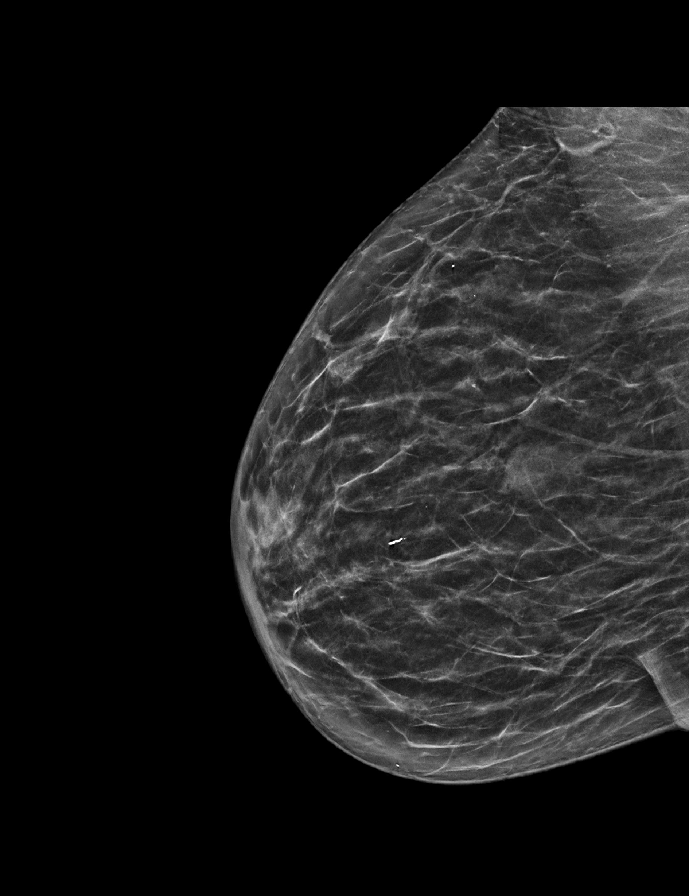

[R CC synth-2D]
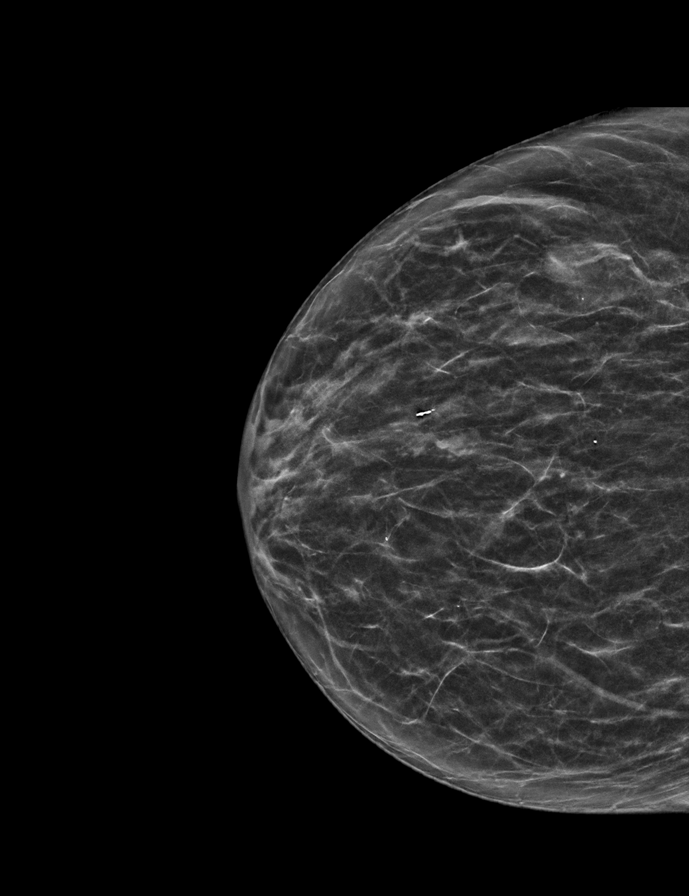

[L CC synth-2D]
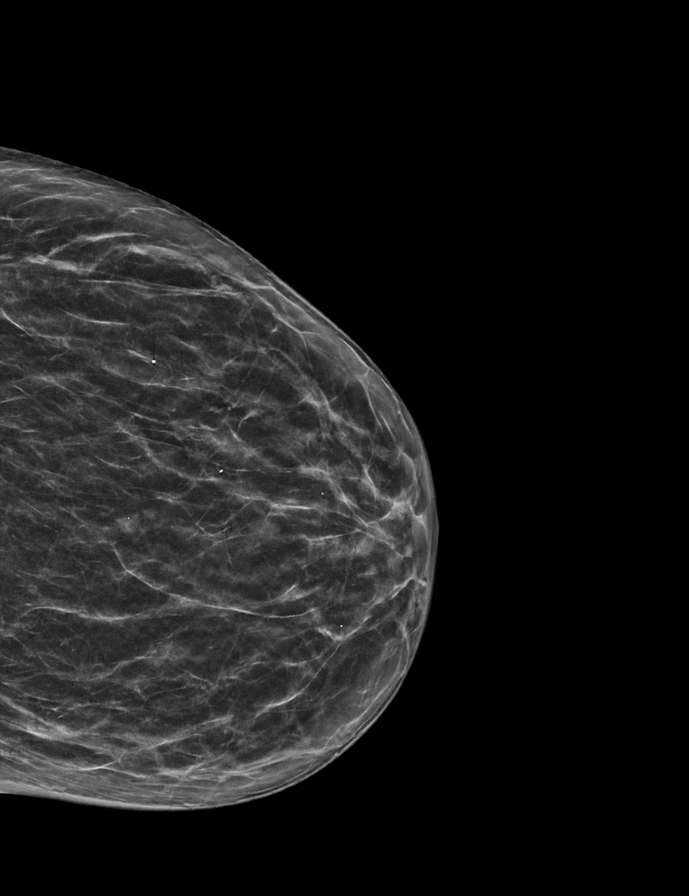

[L MLO synth-2D]
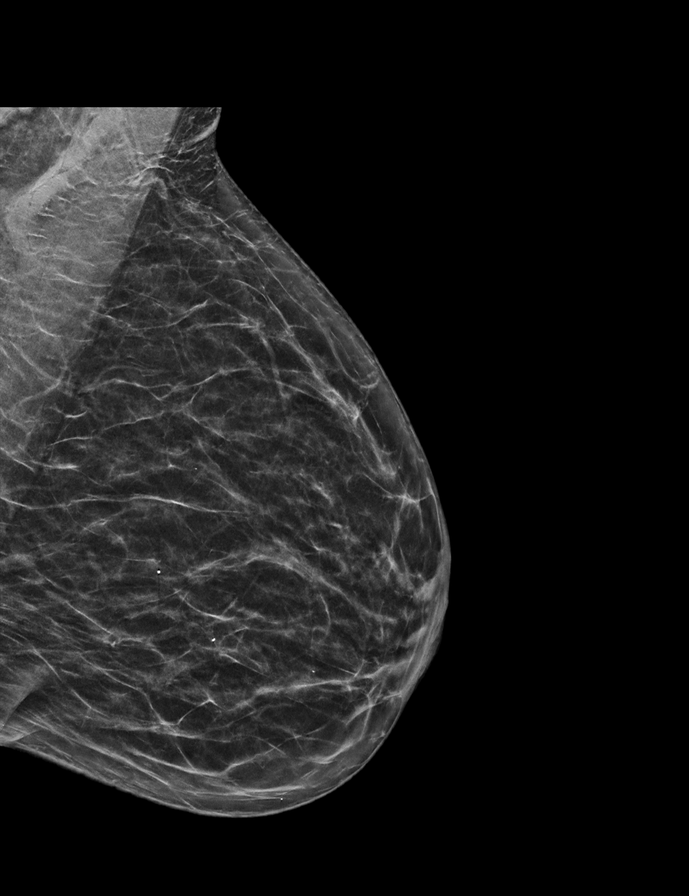

[L MLO tomo · 2 of 53 frames shown]
[frame 18/53]
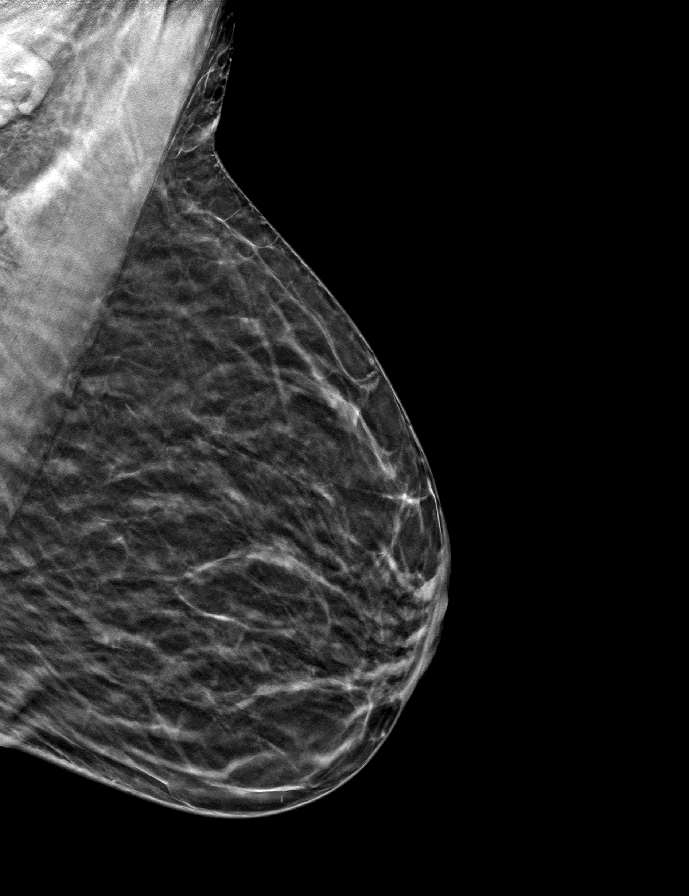
[frame 27/53]
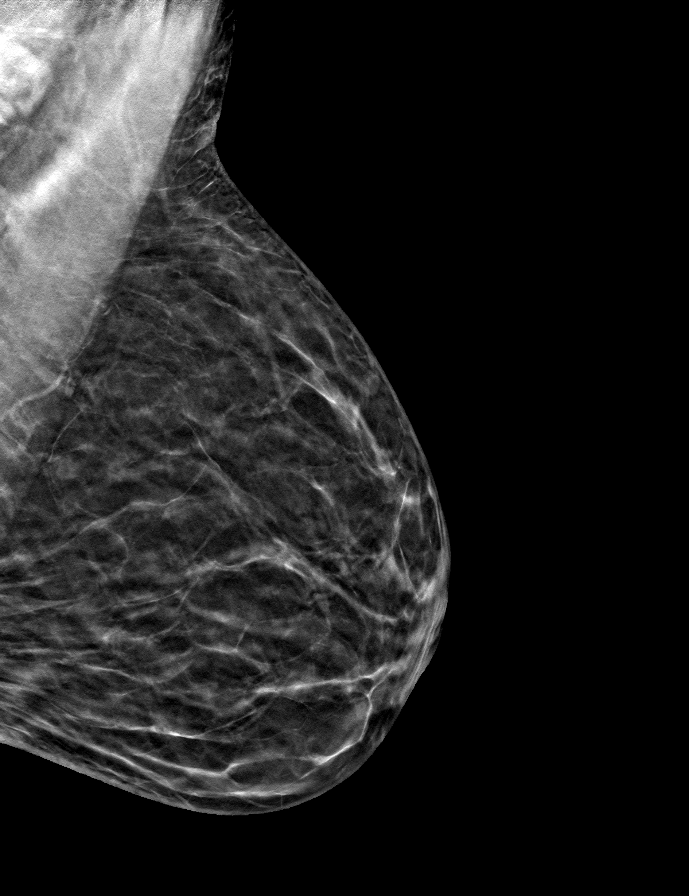

[R CC tomo · tomo slice 25/49.0]
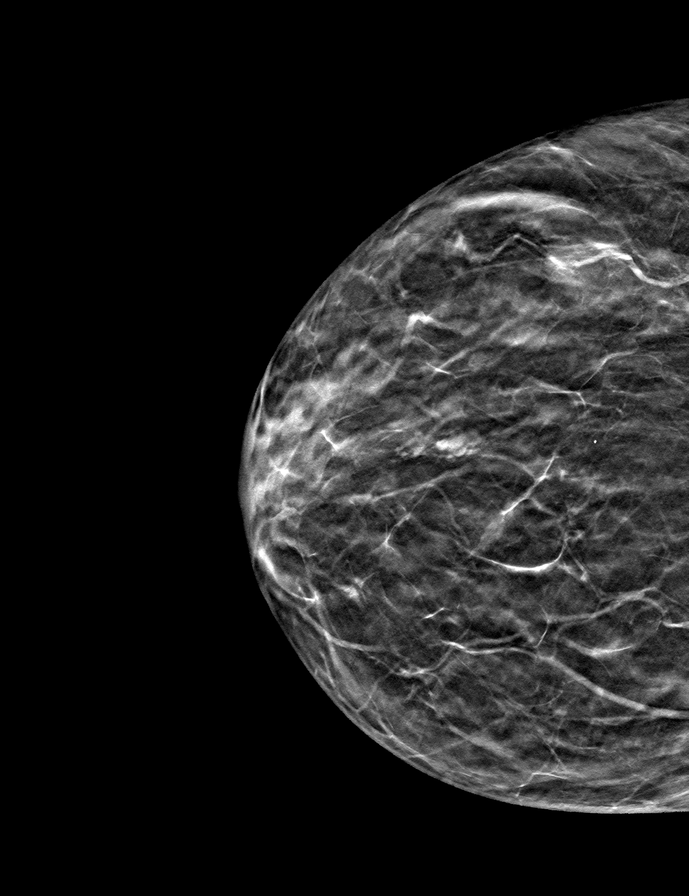

[L CC tomo · tomo slice 26/51.0]
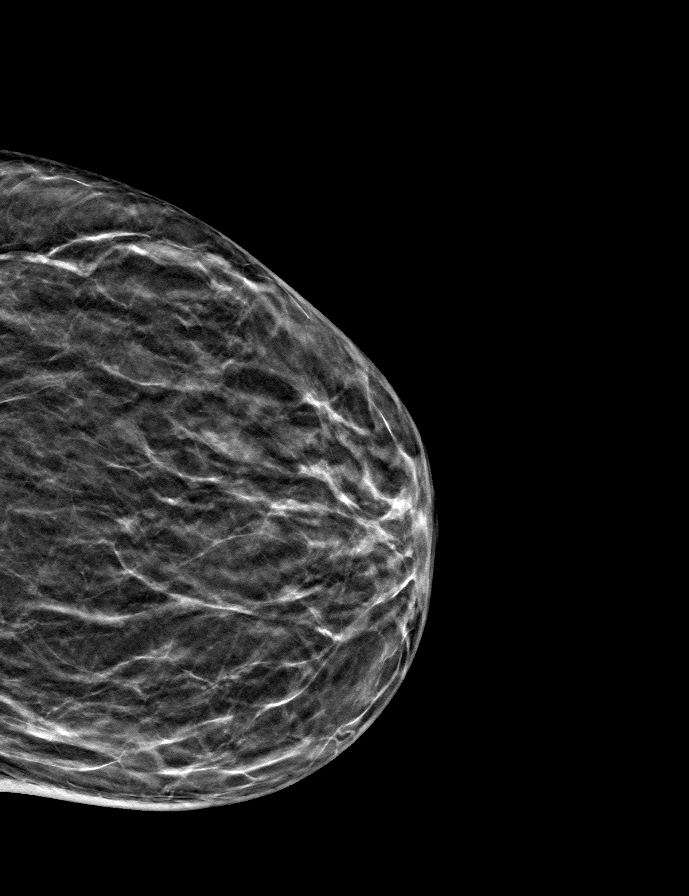

[R MLO tomo · tomo slice 28/55.0]
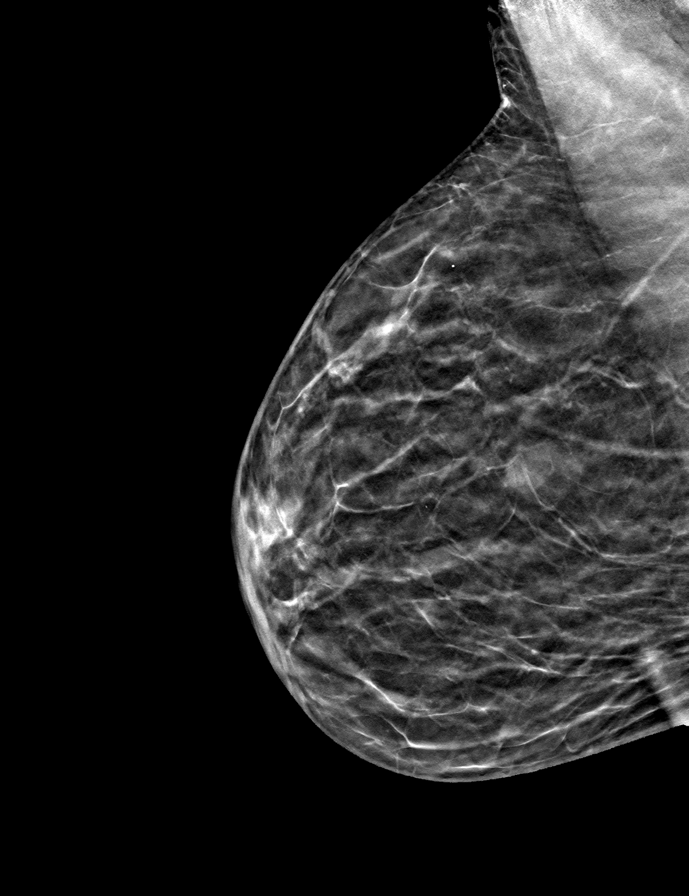

[9 of 24 positions shown; findings below may reference images not displayed]

ACR Breast Density Category b: There are scattered areas of
fibroglandular density.
FINDINGS: There are no findings suspicious for malignancy.
IMPRESSION: No mammographic evidence of malignancy. A result letter of this
screening mammogram will be mailed directly to the patient.

RECOMMENDATION:
Screening mammogram in one year. (Code:51-O-LD2)

BI-RADS CATEGORY  1: Negative.

## 2022-09-10 ENCOUNTER — Inpatient Hospital Stay: Admission: RE | Admit: 2022-09-10 | Payer: Medicare Other | Source: Ambulatory Visit
